# Patient Record
Sex: Female | Born: 1965
Health system: Southern US, Community
[De-identification: ages and names within clinical notes are randomized; demographics above are authoritative.]

## PROBLEM LIST (undated history)

## (undated) DIAGNOSIS — Z309 Encounter for contraceptive management, unspecified: Secondary | ICD-10-CM

## (undated) DIAGNOSIS — Z3046 Encounter for surveillance of implantable subdermal contraceptive: Secondary | ICD-10-CM

## (undated) DIAGNOSIS — I10 Essential (primary) hypertension: Secondary | ICD-10-CM

## (undated) DIAGNOSIS — H409 Unspecified glaucoma: Secondary | ICD-10-CM

## (undated) HISTORY — PX: CHOLECYSTECTOMY: SHX55

## (undated) HISTORY — DX: Unspecified glaucoma: H40.9

## (undated) HISTORY — DX: Essential (primary) hypertension: I10

## (undated) HISTORY — DX: Encounter for contraceptive management, unspecified: Z30.9

## (undated) HISTORY — DX: Encounter for surveillance of implantable subdermal contraceptive: Z30.46

---

## 2001-03-27 ENCOUNTER — Emergency Department (HOSPITAL_COMMUNITY): Admission: EM | Admit: 2001-03-27 | Discharge: 2001-03-27 | Payer: Self-pay | Admitting: Internal Medicine

## 2003-05-04 ENCOUNTER — Emergency Department (HOSPITAL_COMMUNITY): Admission: EM | Admit: 2003-05-04 | Discharge: 2003-05-04 | Payer: Self-pay | Admitting: Emergency Medicine

## 2004-05-13 ENCOUNTER — Ambulatory Visit (HOSPITAL_COMMUNITY): Admission: RE | Admit: 2004-05-13 | Discharge: 2004-05-13 | Payer: Self-pay | Admitting: Ophthalmology

## 2005-03-05 ENCOUNTER — Emergency Department (HOSPITAL_COMMUNITY): Admission: EM | Admit: 2005-03-05 | Discharge: 2005-03-05 | Payer: Self-pay | Admitting: Emergency Medicine

## 2006-11-14 ENCOUNTER — Emergency Department (HOSPITAL_COMMUNITY): Admission: EM | Admit: 2006-11-14 | Discharge: 2006-11-14 | Payer: Self-pay | Admitting: Emergency Medicine

## 2007-06-14 ENCOUNTER — Ambulatory Visit (HOSPITAL_COMMUNITY): Admission: RE | Admit: 2007-06-14 | Discharge: 2007-06-14 | Payer: Self-pay | Admitting: Family Medicine

## 2007-07-20 ENCOUNTER — Ambulatory Visit: Payer: Self-pay | Admitting: Orthopedic Surgery

## 2008-04-21 ENCOUNTER — Emergency Department (HOSPITAL_COMMUNITY): Admission: EM | Admit: 2008-04-21 | Discharge: 2008-04-21 | Payer: Self-pay | Admitting: Emergency Medicine

## 2008-05-02 ENCOUNTER — Ambulatory Visit (HOSPITAL_COMMUNITY): Admission: RE | Admit: 2008-05-02 | Discharge: 2008-05-02 | Payer: Self-pay | Admitting: Family Medicine

## 2008-08-26 ENCOUNTER — Ambulatory Visit (HOSPITAL_COMMUNITY): Admission: RE | Admit: 2008-08-26 | Discharge: 2008-08-26 | Payer: Self-pay | Admitting: Family Medicine

## 2009-04-24 ENCOUNTER — Encounter: Admission: RE | Admit: 2009-04-24 | Discharge: 2009-05-23 | Payer: Self-pay | Admitting: Family Medicine

## 2010-01-05 ENCOUNTER — Encounter
Admission: RE | Admit: 2010-01-05 | Discharge: 2010-03-03 | Payer: Self-pay | Admitting: Physical Medicine and Rehabilitation

## 2010-02-11 IMAGING — CR DG OS CALCIS 2+V*R*
2 series · 2 of 2 positions shown · non-contrast
Comparison: None

CLINICAL DATA: Right heel pain

RIGHT OS CALCIS - 2+ VIEW

[view not recorded (1 of 2)]
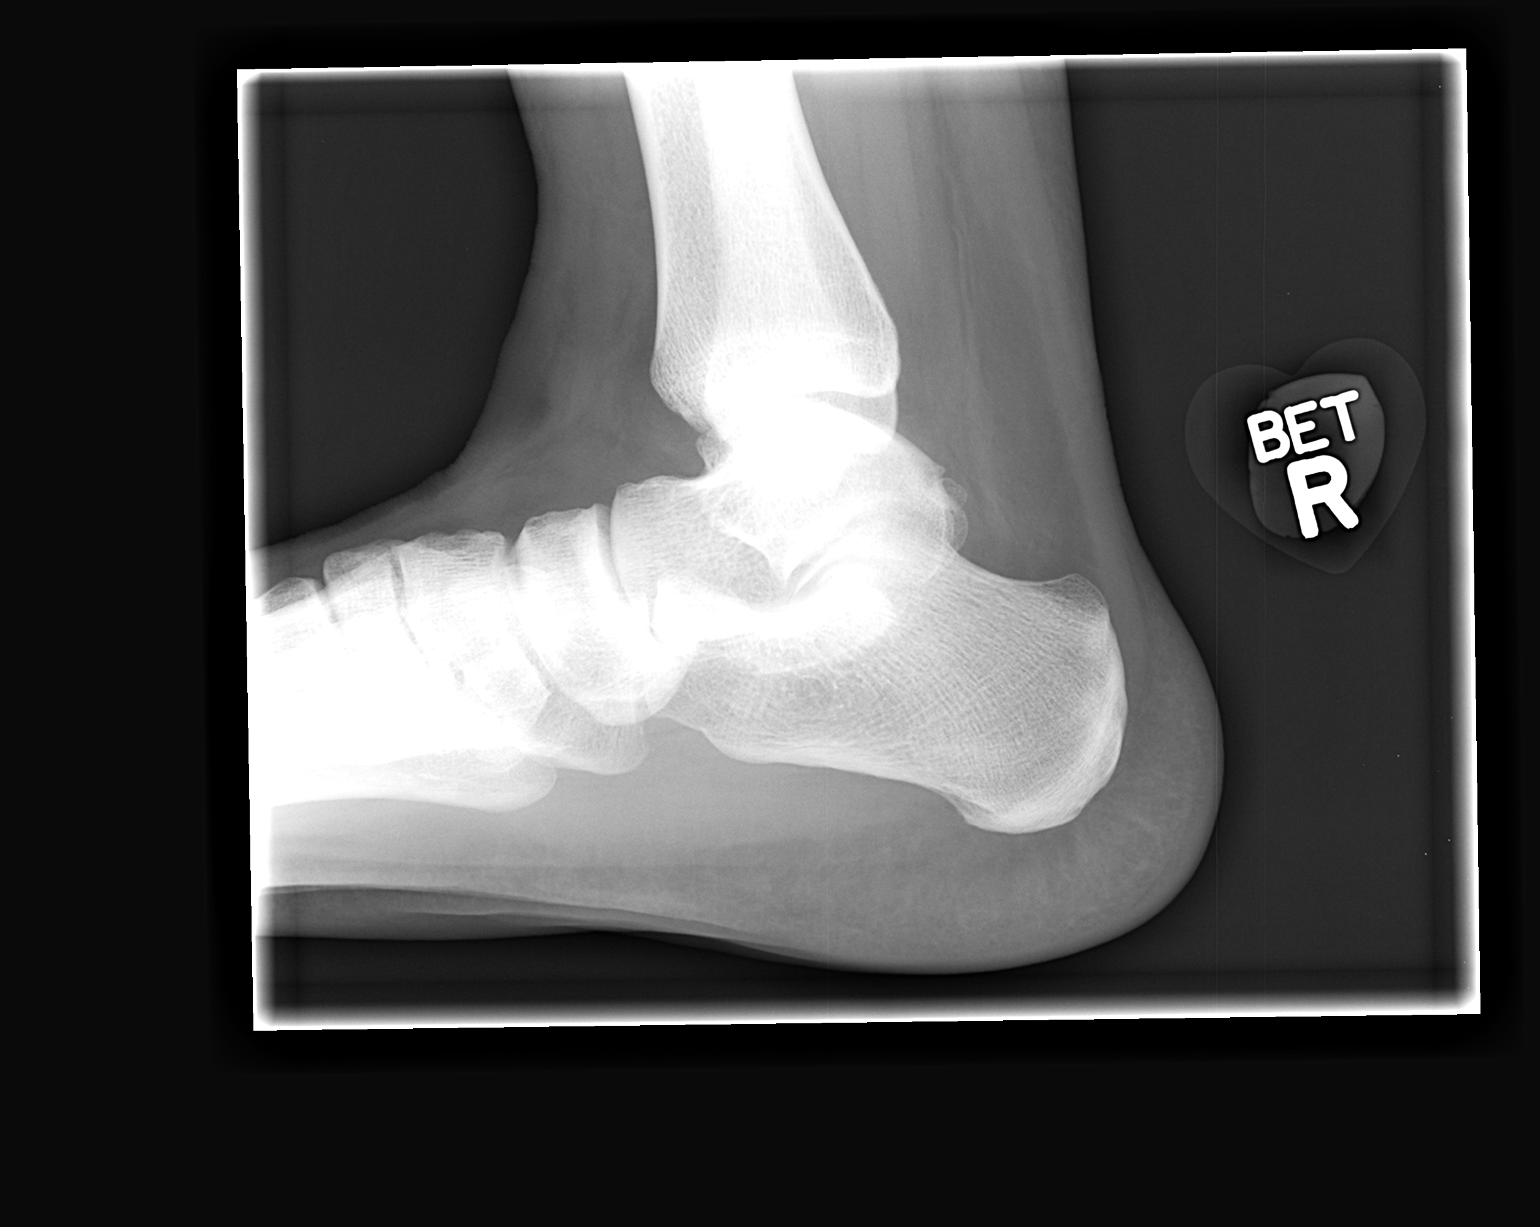

[view not recorded (2 of 2)]
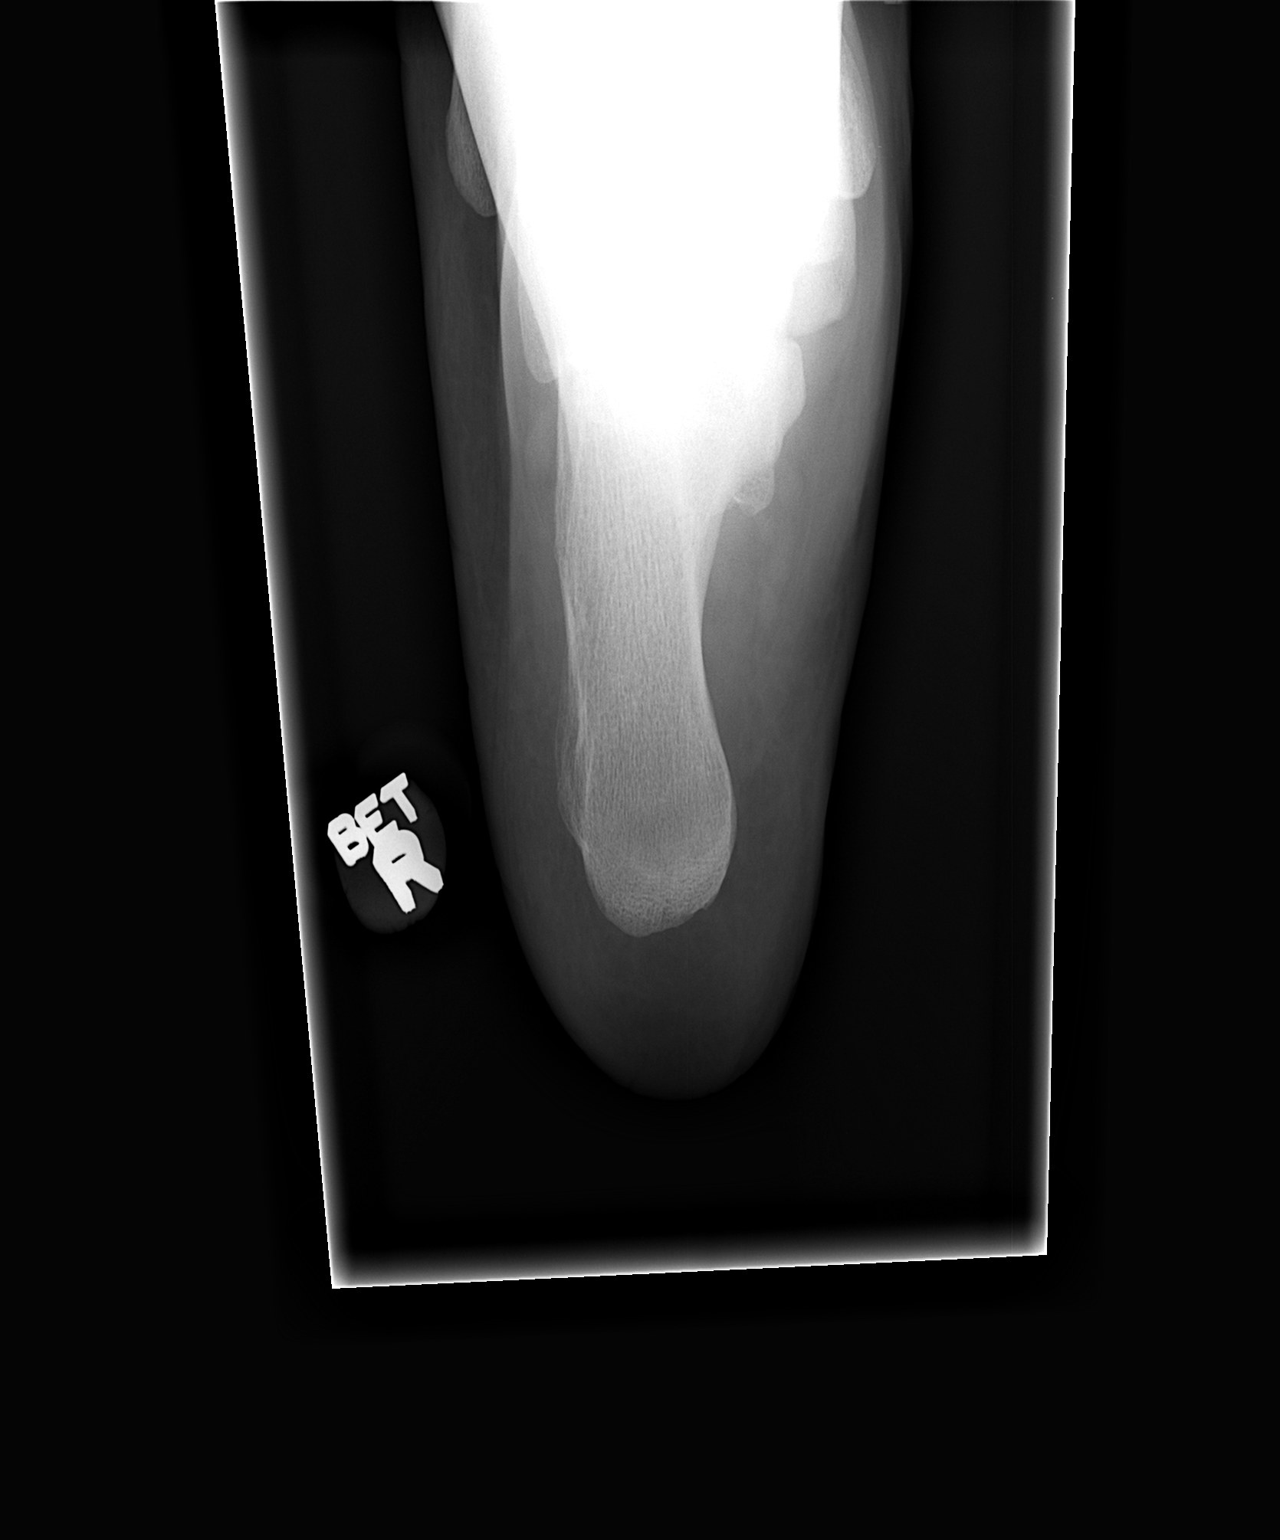

[2 of 2 positions shown; findings below may reference images not displayed]

FINDINGS: No fracture or dislocation.
Joint spaces preserved.
Bone mineralization normal.
No focal soft tissue abnormalities seen.
IMPRESSION: No acute bony abnormalities.

## 2010-09-03 ENCOUNTER — Ambulatory Visit (HOSPITAL_COMMUNITY): Admission: RE | Admit: 2010-09-03 | Discharge: 2010-09-03 | Payer: Self-pay | Admitting: Family Medicine

## 2013-04-25 ENCOUNTER — Telehealth: Payer: Self-pay | Admitting: Obstetrics & Gynecology

## 2013-04-25 NOTE — Telephone Encounter (Signed)
Noted and agree with cytotec and pre placement NSAIDs

## 2013-04-25 NOTE — Telephone Encounter (Signed)
Spoke with pt. Had Mirena placed 5 years ago at [facility]. Has an appt here in July to have it removed and replaced. Had a terrible experience with it as far as having a lot of pain. Pt will be a new pt here so has an appt first to discuss. She was told the IUD would not be placed at her first appt. She is requesting something to help make the insertion easier. I advised her to bring this up at her appt. I mentioned maybe the Dr would order Cytotec. Thanks!!! This is FYI.

## 2013-05-01 ENCOUNTER — Encounter: Payer: Self-pay | Admitting: Obstetrics & Gynecology

## 2013-05-01 ENCOUNTER — Encounter: Payer: Self-pay | Admitting: *Deleted

## 2013-05-02 ENCOUNTER — Encounter: Payer: Self-pay | Admitting: Obstetrics & Gynecology

## 2013-05-02 ENCOUNTER — Ambulatory Visit (INDEPENDENT_AMBULATORY_CARE_PROVIDER_SITE_OTHER): Payer: Medicaid Other | Admitting: Obstetrics & Gynecology

## 2013-05-02 VITALS — BP 110/82 | Ht 69.0 in | Wt 221.5 lb

## 2013-05-02 DIAGNOSIS — T8389XA Other specified complication of genitourinary prosthetic devices, implants and grafts, initial encounter: Secondary | ICD-10-CM

## 2013-05-02 DIAGNOSIS — N949 Unspecified condition associated with female genital organs and menstrual cycle: Secondary | ICD-10-CM

## 2013-05-02 MED ORDER — NAPROXEN SODIUM 550 MG PO TABS: 550.0000 mg | ORAL_TABLET | Freq: Two times a day (BID) | ORAL | Status: DC

## 2013-05-02 MED ORDER — MISOPROSTOL 200 MCG PO TABS: ORAL_TABLET | ORAL | Status: DC

## 2013-05-02 NOTE — Progress Notes (Signed)
Patient ID: Brittany Scott, female   DOB: [birth year], 47 y.o.   MRN: [id number] Patient had a lot of pain and had a vaso vagal response with last Mirena placement.  Might had PTSD(kinda sorta) from it now  Recommend  Pre procedure cytotec and Naproxen sodium Instructions given  Discussed para cervical block but should not need that

## 2013-05-02 NOTE — Patient Instructions (Addendum)

## 2013-05-10 ENCOUNTER — Encounter: Payer: Self-pay | Admitting: Advanced Practice Midwife

## 2013-05-10 ENCOUNTER — Ambulatory Visit (INDEPENDENT_AMBULATORY_CARE_PROVIDER_SITE_OTHER): Payer: Medicaid Other | Admitting: Advanced Practice Midwife

## 2013-05-10 VITALS — BP 130/84 | Ht 69.0 in | Wt 217.5 lb

## 2013-05-10 DIAGNOSIS — Z975 Presence of (intrauterine) contraceptive device: Secondary | ICD-10-CM

## 2013-05-10 DIAGNOSIS — Z3202 Encounter for pregnancy test, result negative: Secondary | ICD-10-CM

## 2013-05-10 DIAGNOSIS — Z30432 Encounter for removal of intrauterine contraceptive device: Secondary | ICD-10-CM

## 2013-05-10 DIAGNOSIS — Z30017 Encounter for initial prescription of implantable subdermal contraceptive: Secondary | ICD-10-CM

## 2013-05-10 LAB — POCT URINE PREGNANCY: Preg Test, Ur: NEGATIVE

## 2013-05-10 NOTE — Addendum Note (Signed)
Addended by: Jacklyn Shell on: 05/10/2013 11:26 AM   Modules accepted: Orders

## 2013-05-10 NOTE — Progress Notes (Signed)
Here for IUD removal.  She had the Mirena IUD placed 5 years ago and would like it removed.  At the time of insertion, she fainted and it was very painful.  She is very nervous abaout today's visit.  Her plans for future contraception are IUD.  A graves speculum was placed, and the strings were visible.  They were grasped with a curved Tresa Endo and the IUD easily removed.   A dilator was then introduced into the cervix, and the pt immediately asked me to stop because it hurt so much.  At this time, she opted not to reinsert the IUD and wants to try nexplanon instead.     Risks/benefits/side effects of Nexplanon have been discussed and her questions have been answered.  Specifically, a failure rate of 11/998 has been reported, with an increased failure rate if pt takes St. John's Wort and/or antiseizure medicaitons.  Brittany Scott is aware of the common side effect of irregular bleeding, which the incidence of decreases over time.  Her left arm, approximatly 4 inches proximal from the elbow, was cleansed with alcohol and anesthetized with 2cc of 2% Lidocaine.  The area was cleansed again and the Nexplanon was inserted without difficulty.  A pressure bandage was applied.  Pt was instructed to remove pressure bandage in a few hours, and keep insertion site covered with a bandaid for 3 days.  Back up contraception was recommended for 1 week.  Follow-up scheduled PRN problems  Brittany Scott,Brittany Scott 05/10/2013 11:17 AM

## 2013-05-10 NOTE — Patient Instructions (Signed)

## 2013-05-14 ENCOUNTER — Telehealth: Payer: Self-pay | Admitting: Adult Health

## 2013-05-14 NOTE — Telephone Encounter (Signed)
Spoke with pt. Had Nexplanon inserted Thursday. Still having some bleeding, but not heavy. Advised this sounds normal. Going on vacation Friday. Advised may still have some bleeding then but hopefully will not get any heavier. Since bleeding is not heavy at this point, we recommend no further treatment. Pt voiced understanding. JSY

## 2013-08-22 ENCOUNTER — Encounter (HOSPITAL_COMMUNITY): Payer: Self-pay | Admitting: Emergency Medicine

## 2013-08-22 ENCOUNTER — Emergency Department (HOSPITAL_COMMUNITY)
Admission: EM | Admit: 2013-08-22 | Discharge: 2013-08-22 | Disposition: A | Discharge status: Home / Self Care | Payer: Medicaid Other | Attending: Emergency Medicine | Admitting: Emergency Medicine

## 2013-08-22 DIAGNOSIS — Z86718 Personal history of other venous thrombosis and embolism: Secondary | ICD-10-CM | POA: Insufficient documentation

## 2013-08-22 DIAGNOSIS — Z853 Personal history of malignant neoplasm of breast: Secondary | ICD-10-CM | POA: Insufficient documentation

## 2013-08-22 DIAGNOSIS — Z87891 Personal history of nicotine dependence: Secondary | ICD-10-CM | POA: Insufficient documentation

## 2013-08-22 DIAGNOSIS — Z8669 Personal history of other diseases of the nervous system and sense organs: Secondary | ICD-10-CM | POA: Insufficient documentation

## 2013-08-22 DIAGNOSIS — H65191 Other acute nonsuppurative otitis media, right ear: Secondary | ICD-10-CM

## 2013-08-22 DIAGNOSIS — Z792 Long term (current) use of antibiotics: Secondary | ICD-10-CM | POA: Insufficient documentation

## 2013-08-22 DIAGNOSIS — H65199 Other acute nonsuppurative otitis media, unspecified ear: Secondary | ICD-10-CM | POA: Insufficient documentation

## 2013-08-22 DIAGNOSIS — Z86711 Personal history of pulmonary embolism: Secondary | ICD-10-CM | POA: Insufficient documentation

## 2013-08-22 DIAGNOSIS — Z79899 Other long term (current) drug therapy: Secondary | ICD-10-CM | POA: Insufficient documentation

## 2013-08-22 MED ORDER — AMOXICILLIN 500 MG PO CAPS: 500.0000 mg | ORAL_CAPSULE | Freq: Three times a day (TID) | ORAL | Status: DC

## 2013-08-22 MED ORDER — ANTIPYRINE-BENZOCAINE 5.4-1.4 % OT SOLN: 3.0000 [drp] | OTIC | Status: DC | PRN

## 2013-08-22 NOTE — ED Notes (Addendum)
Pt c/o right ear pain that started 3 days ago and is progressively getting worse. Pt states she has a hx of ear infections and this may feel like that. Pt states when she yawns and swallows her ear pain increases. Pt denies any problems hearing out of her right ear. Pt states she would not be able to go to sleep tonight because the pain is so bad. Pt denies any nasal congestions. Pt is A&O X4.

## 2013-08-22 NOTE — ED Provider Notes (Signed)
CSN: [id number]     Arrival date & time 08/22/13  2119 History  This chart was scribed for Dierdre Forth, PA-C, working with Junius Argyle, MD by Blanchard Kelch, ED Scribe. This patient was seen in room TR07C/TR07C and the patient's care was started at 10:00 PM.    Chief Complaint  Patient presents with  . Otalgia    Patient is a 47 y.o. female presenting with ear pain. The history is provided by the patient. No language interpreter was used.  Otalgia Associated symptoms: no congestion, no fever, no rash, no sore throat and no vomiting     HPI Comments: Brittany Scott is a 47 y.o. female who presents to the Emergency Department complaining of constant, worsening right ear pain that began three days ago. The pain is worsened by laughing, swallowing or jaw movement. She denies fever, nausea, vomiting, congestion, sore throat. She denies any surgery on her ears. She takes eye drops for glaucoma on a daily basis.  She denies having a PCP but goes to the health department in [place].  Past Medical History  Diagnosis Date  . DVT (deep venous thrombosis)   . PE (pulmonary embolism)   . Cancer     breast  . Glaucoma    History reviewed. No pertinent past surgical history. Family History  Problem Relation Age of Onset  . Hypertension Mother   . Diabetes Brother    History  Substance Use Topics  . Smoking status: Former Smoker    Types: Cigarettes  . Smokeless tobacco: Not on file  . Alcohol Use: Yes     Comment: socially   OB History   Grav Para Term Preterm Abortions TAB SAB Ect Mult Living   1 1 1       1      Review of Systems  Constitutional: Negative for fever.  HENT: Positive for ear pain. Negative for congestion and sore throat.   Gastrointestinal: Negative for nausea and vomiting.  Musculoskeletal: Negative for gait problem.  Skin: Negative for rash.  Neurological: Negative for speech difficulty.  Hematological: Negative for adenopathy.   Psychiatric/Behavioral: Negative for confusion.  All other systems reviewed and are negative.    Allergies  Review of patient's allergies indicates no known allergies.  Home Medications   Current Outpatient Rx  Name  Route  Sig  Dispense  Refill  . etonogestrel (NEXPLANON) 68 MG IMPL implant   Subcutaneous   Inject 1 each into the skin once.         . latanoprost (XALATAN) 0.005 % ophthalmic solution   Both Eyes   Place 1 drop into both eyes at bedtime.          . timolol (TIMOPTIC) 0.25 % ophthalmic solution   Both Eyes   Place 1 drop into both eyes 2 (two) times daily.          Marland Kitchen amoxicillin (AMOXIL) 500 MG capsule   Oral   Take 1 capsule (500 mg total) by mouth 3 (three) times daily.   30 capsule   0   . antipyrine-benzocaine (AURALGAN) otic solution   Right Ear   Place 3 drops into the right ear every 2 (two) hours as needed for pain.   10 mL   0    Triage Vitals: BP 152/95  Pulse 70  Temp(Src) 97.1 F (36.2 C)  Resp 18  SpO2 97%  LMP 06/22/2013  Physical Exam  Nursing note and vitals reviewed. Constitutional: She is oriented to  person, place, and time. She appears well-developed and well-nourished. No distress.  HENT:  Head: Normocephalic and atraumatic.  Right Ear: External ear and ear canal normal. Tympanic membrane is erythematous (mildly) and bulging. A middle ear effusion is present.  Left Ear: Tympanic membrane, external ear and ear canal normal. Tympanic membrane is not erythematous and not bulging.  No middle ear effusion.  Nose: Nose normal. No mucosal edema or rhinorrhea. No epistaxis. Right sinus exhibits no maxillary sinus tenderness and no frontal sinus tenderness. Left sinus exhibits no maxillary sinus tenderness and no frontal sinus tenderness.  Mouth/Throat: Uvula is midline and mucous membranes are normal. Mucous membranes are not pale and not cyanotic. No uvula swelling. No oropharyngeal exudate, posterior oropharyngeal edema,  posterior oropharyngeal erythema or tonsillar abscesses.  Eyes: Conjunctivae are normal. Pupils are equal, round, and reactive to light.  Neck: Normal range of motion and full passive range of motion without pain.  Cardiovascular: Normal rate, normal heart sounds and intact distal pulses.   No murmur heard. Pulmonary/Chest: Effort normal and breath sounds normal. No stridor. No respiratory distress. She has no wheezes.  Abdominal: Soft. Bowel sounds are normal. There is no tenderness.  Musculoskeletal: Normal range of motion.  Lymphadenopathy:    She has no cervical adenopathy.  Neurological: She is alert and oriented to person, place, and time.  Skin: Skin is dry. No rash noted. She is not diaphoretic.  Psychiatric: She has a normal mood and affect.    ED Course  Procedures (including critical care time)  DIAGNOSTIC STUDIES: Oxygen Saturation is 97% on room air, normal by my interpretation.    COORDINATION OF CARE: 10:02 PM -Will order amoxicillin and ear drops for pain. Patient verbalizes understanding and agrees with treatment plan.    Labs Review Labs Reviewed - No data to display Imaging Review No results found.  EKG Interpretation   None       MDM   1. Acute nonsuppurative otitis media of right ear      Brittany Scott presents with otalgia and Hx and PE consistent with OM.  No concern for acute mastoiditis, meningitis.  No antibiotic use in the last month.  Patient discharged home with Amoxicillin.   Pt advised to find PCP follow-up.  I have also discussed reasons to return immediately to the ER.  Parent expresses understanding and agrees with plan.  It has been determined that no acute conditions requiring further emergency intervention are present at this time. The patient/guardian have been advised of the diagnosis and plan. We have discussed signs and symptoms that warrant return to the ED, such as changes or worsening in symptoms.   Vital signs are stable  at discharge.   BP 152/95  Pulse 70  Temp(Src) 97.1 F (36.2 C)  Resp 18  SpO2 97%  LMP 06/22/2013  Patient/guardian has voiced understanding and agreed to follow-up with the PCP or specialist.    I personally performed the services described in this documentation, which was scribed in my presence. The recorded information has been reviewed and is accurate.     Dahlia Client Adhira Jamil, PA-C 08/22/13 2219

## 2013-08-23 NOTE — ED Provider Notes (Signed)
Medical screening examination/treatment/procedure(s) were performed by non-physician practitioner and as supervising physician I was immediately available for consultation/collaboration.     Jacoby Ritsema S Addalynn Kumari, MD 08/23/13 0145 

## 2013-09-11 ENCOUNTER — Other Ambulatory Visit (HOSPITAL_COMMUNITY): Payer: Self-pay | Admitting: Nurse Practitioner

## 2013-09-11 DIAGNOSIS — Z139 Encounter for screening, unspecified: Secondary | ICD-10-CM

## 2013-09-21 ENCOUNTER — Ambulatory Visit (HOSPITAL_COMMUNITY)
Admission: RE | Admit: 2013-09-21 | Discharge: 2013-09-21 | Disposition: A | Discharge status: Home / Self Care | Payer: Medicaid Other | Source: Ambulatory Visit | Attending: Nurse Practitioner | Admitting: Nurse Practitioner

## 2013-09-21 DIAGNOSIS — Z1231 Encounter for screening mammogram for malignant neoplasm of breast: Secondary | ICD-10-CM | POA: Insufficient documentation

## 2013-09-21 DIAGNOSIS — Z139 Encounter for screening, unspecified: Secondary | ICD-10-CM

## 2013-10-16 ENCOUNTER — Emergency Department (HOSPITAL_COMMUNITY)
Admission: EM | Admit: 2013-10-16 | Discharge: 2013-10-16 | Disposition: A | Discharge status: Home / Self Care | Payer: Medicaid Other | Attending: Emergency Medicine | Admitting: Emergency Medicine

## 2013-10-16 ENCOUNTER — Encounter (HOSPITAL_COMMUNITY): Payer: Self-pay | Admitting: Emergency Medicine

## 2013-10-16 DIAGNOSIS — Z86711 Personal history of pulmonary embolism: Secondary | ICD-10-CM | POA: Insufficient documentation

## 2013-10-16 DIAGNOSIS — Z87891 Personal history of nicotine dependence: Secondary | ICD-10-CM | POA: Insufficient documentation

## 2013-10-16 DIAGNOSIS — Z86718 Personal history of other venous thrombosis and embolism: Secondary | ICD-10-CM | POA: Insufficient documentation

## 2013-10-16 DIAGNOSIS — Z853 Personal history of malignant neoplasm of breast: Secondary | ICD-10-CM | POA: Insufficient documentation

## 2013-10-16 DIAGNOSIS — H9201 Otalgia, right ear: Secondary | ICD-10-CM

## 2013-10-16 DIAGNOSIS — Z79899 Other long term (current) drug therapy: Secondary | ICD-10-CM | POA: Insufficient documentation

## 2013-10-16 DIAGNOSIS — H9209 Otalgia, unspecified ear: Secondary | ICD-10-CM | POA: Insufficient documentation

## 2013-10-16 MED ORDER — FLUTICASONE PROPIONATE 50 MCG/ACT NA SUSP: 2.0000 | Freq: Every day | NASAL | Status: DC

## 2013-10-16 NOTE — ED Provider Notes (Signed)
CSN: [id number]     Arrival date & time 10/16/13  1933 History     Chief Complaint  Patient presents with  . Otalgia   HPI Comments: Patient is a 47 year old female who presents today for right ear pain x3 weeks. Patient states that pain is pressure-like in nature and nonradiating. Pain originated 3 weeks ago at which time patient was evaluated and diagnosed with an ear infection. Patient took amoxicillin and used to all drops with relief of symptoms for one week. Patient states, however, that symptoms returned one week ago and have been constant since this time. She denies any modifying factors of her symptoms. Patient denies associated fever, eye redness or pain, sinus congestion or rhinorrhea, hearing loss, tinnitus, ear drainage, bleeding from the ear, outer ear redness or swelling, and neck pain or stiffness.  The history is provided by the patient. No language interpreter was used.   HPI Comments:   Past Medical History  Diagnosis Date  . DVT (deep venous thrombosis)   . PE (pulmonary embolism)   . Cancer     breast  . Glaucoma    History reviewed. No pertinent past surgical history. Family History  Problem Relation Age of Onset  . Hypertension Mother   . Diabetes Brother    History  Substance Use Topics  . Smoking status: Former Smoker    Types: Cigarettes  . Smokeless tobacco: Not on file  . Alcohol Use: Yes     Comment: socially   OB History   Grav Para Term Preterm Abortions TAB SAB Ect Mult Living   1 1 1       1      Review of Systems  HENT: Positive for ear pain.   All other systems reviewed and are negative.    Allergies  Review of patient's allergies indicates no known allergies.  Home Medications   Current Outpatient Rx  Name  Route  Sig  Dispense  Refill  . latanoprost (XALATAN) 0.005 % ophthalmic solution   Both Eyes   Place 1 drop into both eyes at bedtime.          . timolol (TIMOPTIC) 0.25 % ophthalmic solution   Both Eyes   Place 1  drop into both eyes 2 (two) times daily.          Marland Kitchen etonogestrel (NEXPLANON) 68 MG IMPL implant   Subcutaneous   Inject 1 each into the skin once.         . fluticasone (FLONASE) 50 MCG/ACT nasal spray   Each Nare   Place 2 sprays into both nostrils daily.   16 g   0    Triage Vitals:BP 142/86  Pulse 72  Temp(Src) 97.6 F (36.4 C) (Oral)  Resp 17  Wt 234 lb 1 oz (106.17 kg)  SpO2 98%  Physical Exam  Nursing note and vitals reviewed. Constitutional: She is oriented to person, place, and time. She appears well-developed and well-nourished. No distress.  Patient pleasant and well-appearing; in no acute distress  HENT:  Head: Normocephalic and atraumatic.  Right Ear: External ear normal. No drainage or tenderness. No mastoid tenderness. Tympanic membrane is not injected, not scarred, not perforated, not erythematous, not retracted and not bulging. No decreased hearing is noted.  Left Ear: External ear normal. No drainage or tenderness. No mastoid tenderness. Tympanic membrane is not injected, not scarred, not perforated, not erythematous, not retracted and not bulging. No decreased hearing is noted.  Nose: Nose normal. Right sinus exhibits  no maxillary sinus tenderness and no frontal sinus tenderness. Left sinus exhibits no maxillary sinus tenderness and no frontal sinus tenderness.  Mouth/Throat: Uvula is midline, oropharynx is clear and moist and mucous membranes are normal. No trismus in the jaw.  Gross hearing to 3 feet b/l. External ear appears normal b/l. No mastoid tenderness, swelling, erythema, or heat to touch b/l. No tenderness when pulling on auricle or palpating tragus b/l. Canals and TMs without erythema or swelling; No TM bulging, retraction, or perforation.  Airway patent and patient tolerating secretions without difficulty.  Eyes: Conjunctivae and EOM are normal. Pupils are equal, round, and reactive to light. No scleral icterus.  Neck: Normal range of motion. Neck  supple.  Patient moves neck with ease.  Pulmonary/Chest: Effort normal. No respiratory distress.  Musculoskeletal: Normal range of motion.  Neurological: She is alert and oriented to person, place, and time.  GCS 15. Patient moves extremities without ataxia.  Skin: Skin is warm and dry. No rash noted. She is not diaphoretic. No erythema. No pallor.  Psychiatric: She has a normal mood and affect. Her behavior is normal.    ED Course  Procedures  DIAGNOSTIC STUDIES: Oxygen Saturation is 98% on RA, normal by my interpretation.    Labs Review Labs Reviewed - No data to display Imaging Review No results found.  EKG Interpretation   None       MDM   1. Otalgia of right ear    Uncomplicated otalgia of the right ear. Patient well and nontoxic appearing, hemodynamically stable, and afebrile. No mastoid swelling, erythema, or heat to touch bilaterally. Hearing grossly intact and physical examination of bilateral ears unremarkable. No evidence of otitis media. No auricular or tragal tenderness. Patient without nuchal rigidity or meningismus.  Given persistence of symptoms, believe patient warrants followup with ear nose and throat specialist for further evaluation of R otalgia. No acute emergent processes identified on physical examination today. As patient describes the pain as a pressure sensation, will prescribe Flonase to attempt to decrease any residual sinus congestion which may be perpetuating symptoms. Return precautions discussed with the patient who verbalizes comfort and understanding with this d/c plan.   Filed Vitals:   10/16/13 1941  BP: 142/86  Pulse: 72  Temp: 97.6 F (36.4 C)  TempSrc: Oral  Resp: 17  Weight: 234 lb 1 oz (106.17 kg)  SpO2: 98%      Antony Madura, PA-C 10/16/13 2248

## 2013-10-16 NOTE — ED Notes (Signed)
?   Unresolved rt. Ear infection. Did take amoxicillin.

## 2013-10-16 NOTE — ED Notes (Signed)
Pt st's she was here approx 1 month ago with right ear pain.  St's she was given amoxicillin and ear gtts.   Pt st's she took all the antibiotic but ear is still painful.

## 2013-10-20 NOTE — ED Provider Notes (Signed)
Medical screening examination/treatment/procedure(s) were performed by non-physician practitioner and as supervising physician I was immediately available for consultation/collaboration.  EKG Interpretation   None         Usman Millett J Joline Encalada, MD 10/20/13 0706 

## 2013-11-05 ENCOUNTER — Emergency Department (HOSPITAL_COMMUNITY)
Admission: EM | Admit: 2013-11-05 | Discharge: 2013-11-05 | Disposition: A | Discharge status: Home / Self Care | Payer: Medicaid Other | Attending: Emergency Medicine | Admitting: Emergency Medicine

## 2013-11-05 ENCOUNTER — Encounter (HOSPITAL_COMMUNITY): Payer: Self-pay | Admitting: Emergency Medicine

## 2013-11-05 DIAGNOSIS — Z79899 Other long term (current) drug therapy: Secondary | ICD-10-CM | POA: Insufficient documentation

## 2013-11-05 DIAGNOSIS — H9201 Otalgia, right ear: Secondary | ICD-10-CM

## 2013-11-05 DIAGNOSIS — H9209 Otalgia, unspecified ear: Secondary | ICD-10-CM | POA: Insufficient documentation

## 2013-11-05 DIAGNOSIS — H748X9 Other specified disorders of middle ear and mastoid, unspecified ear: Secondary | ICD-10-CM | POA: Insufficient documentation

## 2013-11-05 DIAGNOSIS — IMO0002 Reserved for concepts with insufficient information to code with codable children: Secondary | ICD-10-CM | POA: Insufficient documentation

## 2013-11-05 MED ORDER — FLUCONAZOLE 150 MG PO TABS: 150.0000 mg | ORAL_TABLET | Freq: Once | ORAL | Status: DC

## 2013-11-05 MED ORDER — ANTIPYRINE-BENZOCAINE 5.4-1.4 % OT SOLN: 3.0000 [drp] | OTIC | Status: DC | PRN

## 2013-11-05 MED ORDER — AMOXICILLIN 500 MG PO CAPS: 1000.0000 mg | ORAL_CAPSULE | Freq: Three times a day (TID) | ORAL | Status: DC

## 2013-11-05 NOTE — Discharge Instructions (Signed)
1. Medications: Amoxicillin, Auralgan, usual home medications  2. Treatment: rest, drink plenty of fluids, take medications as prescribed  3. Follow Up: Please followup with your primary doctor and ENT specialist for discussion of your diagnoses and further evaluation after today's visit  Otitis Media with Effusion  Otitis media with effusion is the presence of fluid in the middle ear. This is a common problem that often follows ear infections. It may be present for weeks or longer after the infection. Unlike an acute ear infection, otits media with effusion refers only to fluid behind the ear drum and not infection. Children with repeated ear and sinus infections and allergy problems are the most likely to get otitis media with effusion.  CAUSES  The most frequent cause of the fluid buildup is dysfunction of the eustacian tubes. These are the tubes that drain fluid in the ears to the throat.  SYMPTOMS  The main symptom of this condition is hearing loss. As a result, you or your child may:  Listen to the TV at a loud volume.  Not respond to questions.  Ask "what" often when spoken to.  There may be a sensation of fullness or pressure but usually not pain. DIAGNOSIS  Your caregiver will diagnose this condition by examining you or your child's ears.  Your caregiver may test the pressure in you or your child's ear with a tympanometer.  A hearing test may be conducted if the problem persists.  A caregiver will want to re-evaluate the condition periodically to see if it improves. TREATMENT  Treatment depends on the duration and the effects of the effusion.  Antibiotics, decongestants, nose drops, and cortisone-type drugs may not be helpful.  Children with persistent ear effusions may have delayed language. Children at risk for developmental delays in hearing, learning, and speech may require referral to a specialist earlier than children not at risk.  You or your child's caregiver may suggest a  referral to an Ear, Nose, and Throat (ENT) surgeon for treatment. The following may help restore normal hearing:  Drainage of fluid.  Placement of ear tubes (tympanostomy tubes).  Removal of adenoids (adenoidectomy). HOME CARE INSTRUCTIONS  Avoid second hand smoke.  Infants who are breast fed are less likely to have this condition.  Avoid feeding infants while laying flat.  Avoid known environmental allergens.  Be sure to see a caregiver or an ENT specialist for follow up.  Avoid people who are sick. SEEK MEDICAL CARE IF:  Hearing is not better in 3 months.  Hearing is worse.  Ear pain.  Drainage from the ear.  Dizziness. Document Released: 11/25/2004 Document Revised: 01/10/2012 Document Reviewed: 03/10/2010  [facility] Patient Information 2014 [employer], [place].

## 2013-11-05 NOTE — ED Provider Notes (Signed)
CSN: [id number]     Arrival date & time 11/05/13  1930 History   This chart was scribed for non-physician practitioner Antony Madura, PA-C,working with Rolan Bucco, MD by Nicholos Johns, ED scribe. This patient was seen in room WTR5/WTR5 and the patient's care was started at 9:49 PM.  Chief Complaint  Patient presents with  . Otalgia   Patient is a 48 y.o. female presenting with ear pain. The history is provided by the patient. No language interpreter was used.  Otalgia Location:  Right Behind ear:  No abnormality Quality:  Aching Severity:  Mild Onset quality:  Gradual Duration:  2 months Timing:  Constant Progression:  Unchanged Associated symptoms: no ear discharge, no fever, no hearing loss and no neck pain    HPI Comments: Brittany Scott is a 48 y.o. female who presents to the Emergency Department complaining of unchanged right ear pain since October. Pt states pain is worse at night when she lays down, also worse when swallowing. Reports mild temporary relief upon clearing her throat. Pt was seen on 10/16/13 for same pain, was referred to ENT but states she cannot be seen by them until she goes through her PCP who cannot see her until 11/21/13. Pt was placed on amoxicillin at past visit and states that resolved the issue temporarily. Pt was also prescribed Flonase that provided no relief. Denies fever, swelling of outer ear, hearing loss, neck pain/stiffness, bleeding/discharge from ear, or eye pain.  Past Medical History  Diagnosis Date  . Glaucoma    Past Surgical History  Procedure Laterality Date  . Cholecystectomy     Family History  Problem Relation Age of Onset  . Hypertension Mother   . Diabetes Brother   . Glaucoma Father    History  Substance Use Topics  . Smoking status: Never Smoker   . Smokeless tobacco: Not on file  . Alcohol Use: Yes     Comment: socially   OB History   Grav Para Term Preterm Abortions TAB SAB Ect Mult Living   1 1 1       1       Review of Systems  Constitutional: Negative for fever.  HENT: Positive for ear pain. Negative for ear discharge and hearing loss.   Eyes: Negative for pain.  Musculoskeletal: Negative for neck pain and neck stiffness.  All other systems reviewed and are negative.    Allergies  Review of patient's allergies indicates no known allergies.  Home Medications   Current Outpatient Rx  Name  Route  Sig  Dispense  Refill  . etonogestrel (NEXPLANON) 68 MG IMPL implant   Subcutaneous   Inject 1 each into the skin once.         . fluticasone (FLONASE) 50 MCG/ACT nasal spray   Each Nare   Place 2 sprays into both nostrils daily.   16 g   0   . latanoprost (XALATAN) 0.005 % ophthalmic solution   Both Eyes   Place 1 drop into both eyes at bedtime.          . timolol (TIMOPTIC) 0.25 % ophthalmic solution   Both Eyes   Place 1 drop into both eyes 2 (two) times daily.          Marland Kitchen amoxicillin (AMOXIL) 500 MG capsule   Oral   Take 2 capsules (1,000 mg total) by mouth 3 (three) times daily.   60 capsule   0   . antipyrine-benzocaine (AURALGAN) otic solution   Right  Ear   Place 3-4 drops into the right ear every 2 (two) hours as needed for ear pain.   15 mL   0    Triage Vitals: BP 146/84  Pulse 65  Temp(Src) 98.4 F (36.9 C) (Oral)  Resp 18  Ht 5\' 9"  (1.753 m)  Wt 225 lb (102.059 kg)  BMI 33.21 kg/m2  SpO2 100%  Physical Exam  Nursing note and vitals reviewed. Constitutional: She is oriented to person, place, and time. She appears well-developed and well-nourished. No distress.  HENT:  Head: Normocephalic and atraumatic.  Right Ear: External ear normal. No drainage or tenderness. No mastoid tenderness. Tympanic membrane is not perforated, not erythematous, not retracted and not bulging. A middle ear effusion is present.  Left Ear: Tympanic membrane, external ear and ear canal normal. No drainage. No mastoid tenderness. Tympanic membrane is not perforated, not  erythematous, not retracted and not bulging.  No middle ear effusion.  Nose: Nose normal.  No tenderness, swelling, or erythema to mastoid process bilaterally. No external ear tenderness or swelling to R ear.  Eyes: Conjunctivae and EOM are normal. No scleral icterus.  Neck: Normal range of motion. Neck supple.  No nuchal rigidity or meningismus  Pulmonary/Chest: Effort normal. No respiratory distress.  Musculoskeletal: Normal range of motion.  Neurological: She is alert and oriented to person, place, and time.  Skin: Skin is warm and dry. No rash noted. She is not diaphoretic. No erythema. No pallor.  Psychiatric: She has a normal mood and affect. Her behavior is normal.    ED Course  Procedures (including critical care time) DIAGNOSTIC STUDIES: Oxygen Saturation is 100% on room air, normal by my interpretation.    COORDINATION OF CARE: At 9:44 PM: Discussed treatment plan with patient which includes amoxicillin and following up with her PCP. Patient agrees.   Labs Review Labs Reviewed - No data to display Imaging Review No results found.  EKG Interpretation   None       MDM   1. Otalgia of right ear    Uncomplicated otalgia of the right ear. Symptoms have been ongoing for the past 2 months. Patient denies any trauma or injury to her ear. She denies any fever, neck pain or stiffness, or hearing loss. Patient well and nontoxic appearing, hemodynamically stable, and afebrile. Physical exam significant for a middle ear effusion on the right of clear serous fluid. Patient has not followed up with ENT as previously recommended. Have again referred patient to ENT as well as to her primary care doctor. She endorses a follow up appointment in the coming weeks. Patient appropriate for discharge with course of amoxicillin and Auralgan which provided her some relief back in October. Return precautions discussed and patient agreeable to plan with no unaddressed concerns.  I personally  performed the services described in this documentation, which was scribed in my presence. The recorded information has been reviewed and is accurate.       Antony MaduraKelly Justyne Roell, PA-C 11/07/13 [phone]

## 2013-11-05 NOTE — ED Notes (Signed)
Pt is c/o right ear pain  Pt states she was seen for this before and was referred to an ENT but was going to follow up with her PCP but they cannot see her until the 21st  Pt states the pain is between her ear and her throat and feels like it is draining on that side causing her to cough

## 2013-11-17 NOTE — ED Provider Notes (Signed)
Medical screening examination/treatment/procedure(s) were performed by non-physician practitioner and as supervising physician I was immediately available for consultation/collaboration.  EKG Interpretation   None         Aleah Ahlgrim, MD 11/17/13 1102 

## 2014-07-30 ENCOUNTER — Ambulatory Visit (INDEPENDENT_AMBULATORY_CARE_PROVIDER_SITE_OTHER): Payer: Medicaid Other | Admitting: Adult Health

## 2014-07-30 ENCOUNTER — Encounter: Payer: Self-pay | Admitting: Adult Health

## 2014-07-30 VITALS — BP 138/86 | Ht 69.0 in | Wt 232.5 lb

## 2014-07-30 DIAGNOSIS — Z3046 Encounter for surveillance of implantable subdermal contraceptive: Secondary | ICD-10-CM | POA: Insufficient documentation

## 2014-07-30 DIAGNOSIS — Z309 Encounter for contraceptive management, unspecified: Secondary | ICD-10-CM

## 2014-07-30 DIAGNOSIS — Z3202 Encounter for pregnancy test, result negative: Secondary | ICD-10-CM

## 2014-07-30 DIAGNOSIS — Z30011 Encounter for initial prescription of contraceptive pills: Secondary | ICD-10-CM

## 2014-07-30 HISTORY — DX: Encounter for surveillance of implantable subdermal contraceptive: Z30.46

## 2014-07-30 HISTORY — DX: Encounter for contraceptive management, unspecified: Z30.9

## 2014-07-30 LAB — POCT URINE PREGNANCY: Preg Test, Ur: NEGATIVE

## 2014-07-30 MED ORDER — LEVONORG-ETH ESTRAD TRIPHASIC PO TABS
1.0000 | ORAL_TABLET | Freq: Every day | ORAL | Status: DC
Start: 2014-07-30 — End: 2014-11-14

## 2014-07-30 NOTE — Progress Notes (Signed)
Subjective:     Patient ID: Brittany Scott, female   DOB: 1965/11/26, 48 y.o.   MRN: [id number]  HPI Brittany Scott is a 48 year old black female, in for nexplanon removal, has been having headaches and wants to go on Triphasil used to take in past.  Review of Systems See HPI Reviewed past medical,surgical, social and family history. Reviewed medications and allergies.     Objective:   Physical Exam BP 138/86  Ht 5\' 9"  (1.753 m)  Wt 232 lb 8 oz (105.461 kg)  BMI 34.32 kg/m2  LMP 09/15/2015UPT negative, verbal consent obtained,time out called, Left arm cleansed with betadine, and injected with 1.5 cc 2% lidocaine and waited til numb.Under sterile technique a #11 blade was used to make small vertical incision, and a curved forceps was used to easily remove rod. Steri strips applied. Pressure dressing applied.     Assessment:     Nexplanon removal Contraceptive management    Plan:     Use condoms x 4 weeks, keep clean and dry x 24 hours, no heavy lifting, keep steri strips on x 72 hours, Keep pressure dressing on x 24 hours. Follow up prn problems.   Rx Triphasil take 1 daily disp 1 pack with 3 refills Return in 3 months for ROS and BP check

## 2014-07-30 NOTE — Patient Instructions (Signed)
Use condoms x 4 weeks, keep clean and dry x 24 hours, no heavy lifting, keep steri strips on x 72 hours, Keep pressure dressing on x 24 hours. Follow up prn problems. Return un in 3 months for ROS Start OCs today

## 2014-09-02 ENCOUNTER — Encounter: Payer: Self-pay | Admitting: Adult Health

## 2014-11-04 ENCOUNTER — Telehealth: Payer: Self-pay | Admitting: Adult Health

## 2014-11-04 ENCOUNTER — Ambulatory Visit: Payer: Medicaid Other | Admitting: Adult Health

## 2014-11-04 NOTE — Telephone Encounter (Signed)
Spoke with pt. Pt has enough birth control to last 2 more weeks. She is having insurance issues, but thinks it will be resolved this week. Pt needs appt to follow up on birth control. I advised to schedule an appt for next week and if insurance issues have not resolved, she can call back and reschedule. Pt voiced understanding. Call transferred to front desk for appt. JSY

## 2014-11-14 ENCOUNTER — Telehealth: Payer: Self-pay | Admitting: Adult Health

## 2014-11-14 MED ORDER — LEVONORG-ETH ESTRAD TRIPHASIC PO TABS: 1.0000 | ORAL_TABLET | Freq: Every day | ORAL | Status: DC

## 2014-11-14 NOTE — Telephone Encounter (Signed)
Will refill OCs x 1 

## 2014-11-14 NOTE — Telephone Encounter (Signed)
Spoke with pt. Pt had to reschedule appt to next week but she needs a refill on birth control. She will run out Saturday. Can you refill med? Thanks!!

## 2014-11-14 NOTE — Telephone Encounter (Signed)
Left message x 1. JSY 

## 2014-11-15 ENCOUNTER — Ambulatory Visit: Payer: Medicaid Other | Admitting: Adult Health

## 2014-11-21 ENCOUNTER — Ambulatory Visit (INDEPENDENT_AMBULATORY_CARE_PROVIDER_SITE_OTHER): Payer: Medicaid Other | Admitting: Adult Health

## 2014-11-21 ENCOUNTER — Encounter: Payer: Self-pay | Admitting: Adult Health

## 2014-11-21 ENCOUNTER — Other Ambulatory Visit (HOSPITAL_COMMUNITY)
Admission: RE | Admit: 2014-11-21 | Discharge: 2014-11-21 | Disposition: A | Discharge status: Home / Self Care | Payer: Medicaid Other | Source: Ambulatory Visit | Attending: Adult Health | Admitting: Adult Health

## 2014-11-21 VITALS — BP 160/90 | HR 76 | Ht 69.0 in | Wt 219.0 lb

## 2014-11-21 DIAGNOSIS — I10 Essential (primary) hypertension: Secondary | ICD-10-CM

## 2014-11-21 DIAGNOSIS — Z01419 Encounter for gynecological examination (general) (routine) without abnormal findings: Secondary | ICD-10-CM

## 2014-11-21 DIAGNOSIS — Z1151 Encounter for screening for human papillomavirus (HPV): Secondary | ICD-10-CM | POA: Insufficient documentation

## 2014-11-21 DIAGNOSIS — Z308 Encounter for other contraceptive management: Secondary | ICD-10-CM

## 2014-11-21 DIAGNOSIS — Z3041 Encounter for surveillance of contraceptive pills: Secondary | ICD-10-CM

## 2014-11-21 HISTORY — DX: Essential (primary) hypertension: I10

## 2014-11-21 LAB — HEMOCCULT GUIAC POC 1CARD (OFFICE): Fecal Occult Blood, POC: NEGATIVE

## 2014-11-21 MED ORDER — LEVONORG-ETH ESTRAD TRIPHASIC PO TABS: 1.0000 | ORAL_TABLET | Freq: Every day | ORAL | Status: DC

## 2014-11-21 MED ORDER — HYDROCHLOROTHIAZIDE 12.5 MG PO CAPS
12.5000 mg | ORAL_CAPSULE | Freq: Every day | ORAL | Status: DC
Start: 2014-11-21 — End: 2015-11-27

## 2014-11-21 NOTE — Patient Instructions (Signed)
Start microzide  Physical in 1 year Mammogram yearly Recheck BP in 4 weeks Decrease salt and work on weight  Try Whole 30 Hypertension Hypertension, commonly called high blood pressure, is when the force of blood pumping through your arteries is too strong. Your arteries are the blood vessels that carry blood from your heart throughout your body. A blood pressure reading consists of a higher number over a lower number, such as 110/72. The higher number (systolic) is the pressure inside your arteries when your heart pumps. The lower number (diastolic) is the pressure inside your arteries when your heart relaxes. Ideally you want your blood pressure below 120/80. Hypertension forces your heart to work harder to pump blood. Your arteries may become narrow or stiff. Having hypertension puts you at risk for heart disease, stroke, and other problems.  RISK FACTORS Some risk factors for high blood pressure are controllable. Others are not.  Risk factors you cannot control include:   Race. You may be at higher risk if you are African American.  Age. Risk increases with age.  Gender. Men are at higher risk than women before age 48 years. After age 48, women are at higher risk than men. Risk factors you can control include:  Not getting enough exercise or physical activity.  Being overweight.  Getting too much fat, sugar, calories, or salt in your diet.  Drinking too much alcohol. SIGNS AND SYMPTOMS Hypertension does not usually cause signs or symptoms. Extremely high blood pressure (hypertensive crisis) may cause headache, anxiety, shortness of breath, and nosebleed. DIAGNOSIS  To check if you have hypertension, your health care provider will measure your blood pressure while you are seated, with your arm held at the level of your heart. It should be measured at least twice using the same arm. Certain conditions can cause a difference in blood pressure between your right and left arms. A blood  pressure reading that is higher than normal on one occasion does not mean that you need treatment. If one blood pressure reading is high, ask your health care provider about having it checked again. TREATMENT  Treating high blood pressure includes making lifestyle changes and possibly taking medicine. Living a healthy lifestyle can help lower high blood pressure. You may need to change some of your habits. Lifestyle changes may include:  Following the DASH diet. This diet is high in fruits, vegetables, and whole grains. It is low in salt, red meat, and added sugars.  Getting at least 2 hours of brisk physical activity every week.  Losing weight if necessary.  Not smoking.  Limiting alcoholic beverages.  Learning ways to reduce stress. If lifestyle changes are not enough to get your blood pressure under control, your health care provider may prescribe medicine. You may need to take more than one. Work closely with your health care provider to understand the risks and benefits. HOME CARE INSTRUCTIONS  Have your blood pressure rechecked as directed by your health care provider.   Take medicines only as directed by your health care provider. Follow the directions carefully. Blood pressure medicines must be taken as prescribed. The medicine does not work as well when you skip doses. Skipping doses also puts you at risk for problems.   Do not smoke.   Monitor your blood pressure at home as directed by your health care provider. SEEK MEDICAL CARE IF:   You think you are having a reaction to medicines taken.  You have recurrent headaches or feel dizzy.  You have  swelling in your ankles.  You have trouble with your vision. SEEK IMMEDIATE MEDICAL CARE IF:  You develop a severe headache or confusion.  You have unusual weakness, numbness, or feel faint.  You have severe chest or abdominal pain.  You vomit repeatedly.  You have trouble breathing. MAKE SURE YOU:   Understand  these instructions.  Will watch your condition.  Will get help right away if you are not doing well or get worse. Document Released: 10/18/2005 Document Revised: 03/04/2014 Document Reviewed: 08/10/2013 [facility] Patient Information 2015 [place], [place]. This information is not intended to replace advice given to you by your health care provider. Make sure you discuss any questions you have with your health care provider.

## 2014-11-21 NOTE — Progress Notes (Signed)
Patient ID: Brittany Scott, female   DOB: [birth year], 49 y.o.   MRN: [id number] History of Present Illness: Brittany Scott is a 49 year old black female in for pap and physical.She is happy with her pills. Has glaucoma.  Current Medications, Allergies, Past Medical History, Past Surgical History, Family History and Social History were reviewed in [facility] electronic medical record.     Review of Systems: Patient denies any daily headaches,( has one before period), shortness of breath, chest pain, abdominal pain, problems with bowel movements, urination, or intercourse. No joint pain or mood swings.    Physical Exam:BP 160/90 mmHg  Pulse 76  Ht 5\' 9"  (1.753 m)  Wt 219 lb (99.338 kg)  BMI 32.33 kg/m2  LMP 01/14/2016BP was 154/88 on arrival. General:  Well developed, well nourished, no acute distress Skin:  Warm and dry Neck:  Midline trachea, normal thyroid Lungs; Clear to auscultation bilaterally Breast:  No dominant palpable mass, retraction, or nipple discharge Cardiovascular: Regular rate and rhythm Abdomen:  Soft, non tender, no hepatosplenomegaly Pelvic:  External genitalia is normal in appearance,no lesions. The vagina is normal in appearance. The cervix is bulbous,pap with HPV peformed.  Uterus is felt to be normal size, shape, and contour.  No   adnexal masses or tenderness noted. Rectal: Good sphincter tone, no polyps, or hemorrhoids felt.  Hemoccult negative. Extremities:  No swelling or varicosities noted Psych:  No mood changes,alert and cooperative,seems happy   Impression: Well woman gyn exam with pap Hypertension Contraceptive management    Plan: Return in 4 weeks for BP check Physical in 1 year Mammogram yearly Refilled enpresse x 1 year Rx microzide 12.5 mg #30 1 daily with 11 refills Decrease salt and try to lose weight, try Whole 30 Check GC/CHL,HIV,RPR,HSV 2

## 2014-11-22 LAB — GC/CHLAMYDIA PROBE AMP
CT Probe RNA: NEGATIVE
GC PROBE AMP APTIMA: NEGATIVE

## 2014-11-22 LAB — HIV ANTIBODY (ROUTINE TESTING W REFLEX): HIV: NONREACTIVE

## 2014-11-22 LAB — RPR

## 2014-11-25 LAB — HSV 2 ANTIBODY, IGG: HSV 2 GLYCOPROTEIN G AB, IGG: 0.26 IV

## 2014-11-27 LAB — CYTOLOGY - PAP

## 2014-12-19 ENCOUNTER — Ambulatory Visit: Payer: Medicaid Other | Admitting: Adult Health

## 2014-12-23 ENCOUNTER — Encounter: Payer: Self-pay | Admitting: Adult Health

## 2014-12-23 ENCOUNTER — Ambulatory Visit (INDEPENDENT_AMBULATORY_CARE_PROVIDER_SITE_OTHER): Payer: Self-pay | Admitting: Adult Health

## 2014-12-23 VITALS — BP 140/84 | Ht 69.0 in | Wt 213.0 lb

## 2014-12-23 DIAGNOSIS — I10 Essential (primary) hypertension: Secondary | ICD-10-CM

## 2014-12-23 NOTE — Progress Notes (Signed)
Subjective:     Patient ID: Brittany BougieLesley C Cubbage, female   DOB: November 02, 1965, 49 y.o.   MRN: [id number]  HPI Cathlean CowerLesley is a 49 year old black female in for BP check and weight check.She is feeling good and has lost about 6 lbs.  Review of Systems Patient denies any headaches, hearing loss, fatigue, blurred vision, shortness of breath, chest pain, abdominal pain, problems with bowel movements, urination, or intercourse. No joint pain or mood swings. Reviewed past medical,surgical, social and family history. Reviewed medications and allergies.     Objective:   Physical Exam BP 140/84 mmHg  Ht 5\' 9"  (1.753 m)  Wt 213 lb (96.616 kg)  BMI 31.44 kg/m2  LMP 12/16/2014   Skin warm and dry.  Lungs: clear to ausculation bilaterally. Cardiovascular: regular rate and rhythm.  Assessment:     Hypertension     Plan:    Continue weight loss efforts Continue microzide Follow up in 6 months

## 2014-12-23 NOTE — Patient Instructions (Signed)
Continue microzide and weight loss  Follow up in 6 months

## 2015-11-17 ENCOUNTER — Other Ambulatory Visit: Payer: Self-pay | Admitting: Adult Health

## 2015-11-27 ENCOUNTER — Ambulatory Visit (INDEPENDENT_AMBULATORY_CARE_PROVIDER_SITE_OTHER): Payer: Medicaid Other | Admitting: Adult Health

## 2015-11-27 ENCOUNTER — Encounter: Payer: Self-pay | Admitting: Adult Health

## 2015-11-27 VITALS — BP 140/90 | HR 70 | Ht 67.5 in | Wt 217.0 lb

## 2015-11-27 DIAGNOSIS — Z113 Encounter for screening for infections with a predominantly sexual mode of transmission: Secondary | ICD-10-CM

## 2015-11-27 DIAGNOSIS — Z309 Encounter for contraceptive management, unspecified: Secondary | ICD-10-CM

## 2015-11-27 DIAGNOSIS — Z01419 Encounter for gynecological examination (general) (routine) without abnormal findings: Secondary | ICD-10-CM

## 2015-11-27 DIAGNOSIS — I10 Essential (primary) hypertension: Secondary | ICD-10-CM

## 2015-11-27 DIAGNOSIS — Z3041 Encounter for surveillance of contraceptive pills: Secondary | ICD-10-CM

## 2015-11-27 LAB — HEMOCCULT GUIAC POC 1CARD (OFFICE): Fecal Occult Blood, POC: NEGATIVE

## 2015-11-27 MED ORDER — HYDROCHLOROTHIAZIDE 12.5 MG PO CAPS: 12.5000 mg | ORAL_CAPSULE | Freq: Every day | ORAL | Status: DC

## 2015-11-27 MED ORDER — LEVONORG-ETH ESTRAD TRIPHASIC PO TABS: 1.0000 | ORAL_TABLET | Freq: Every day | ORAL | Status: DC

## 2015-11-27 NOTE — Patient Instructions (Signed)
Physical in 1 year Mammogram yearly  

## 2015-11-27 NOTE — Progress Notes (Signed)
Patient ID: Brittany Scott, female   DOB: August 04, 1966, 50 y.o.   MRN: [id number] History of Present Illness: Brittany Scott is a 50 year old black female in for a well woman gyn exam and get refills on OCs.She had a normal pap with negative HPV 11/21/14.She says her weight has reached a plateau, she is going to the gym and does spin class and watching her carbs, and can't lose. She has glaucoma and goes to [facility] but next month sees new doctor in [place].  Current Medications, Allergies, Past Medical History, Past Surgical History, Family History and Social History were reviewed in [facility] record.     Review of Systems: Patient denies any headaches, hearing loss, fatigue, blurred vision, shortness of breath, chest pain, abdominal pain, problems with bowel movements, urination, or intercourse. No joint pain or mood swings.    Physical Exam:BP 140/90 mmHg  Pulse 70  Ht 5' 7.5" (1.715 m)  Wt 217 lb (98.431 kg)  BMI 33.47 kg/m2  LMP 11/11/2015 General:  Well developed, well nourished, no acute distress Skin:  Warm and dry Neck:  Midline trachea, normal thyroid, good ROM, no lymphadenopathy Lungs; Clear to auscultation bilaterally Breast:  No dominant palpable mass, retraction, or nipple discharge Cardiovascular: Regular rate and rhythm Abdomen:  Soft, non tender, no hepatosplenomegaly Pelvic:  External genitalia is normal in appearance, no lesions.  The vagina is normal in appearance. Urethra has no lesions or masses. The cervix is bulbous. GC/CHL probe obtained. Uterus is felt to be normal size, shape, and contour.  No adnexal masses or tenderness noted.Bladder is non tender, no masses felt. Rectal: Good sphincter tone, no polyps, or hemorrhoids felt.  Hemoccult negative. Extremities/musculoskeletal:  No swelling or varicosities noted, no clubbing or cyanosis Psych:  No mood changes, alert and cooperative,seems happy Discussed decreasing smoothies and increasing whole  veggies and fruits, do weight training 2 day and cardio 3 days and whatever is fun on 6th day.Cut portion size and increase water.   Impression: Well woman gyn exam no pap Contraceptive management Hypertension STD screening    Plan: GC/CHL sent Check HIV,RPR and HSV 2 Refilled microzide 12.5 mg x 1 year, take 1 daily disp #30 Refilled enpresse x 1 year, take 1 daily disp 1 pack Physical in 1 year Mammogram yearly

## 2015-11-28 LAB — HIV ANTIBODY (ROUTINE TESTING W REFLEX): HIV Screen 4th Generation wRfx: NONREACTIVE

## 2015-11-28 LAB — RPR: RPR: NONREACTIVE

## 2015-11-28 LAB — HSV 2 ANTIBODY, IGG: HSV 2 Glycoprotein G Ab, IgG: <0.91 index (ref 0.00–0.90)

## 2015-11-30 LAB — GC/CHLAMYDIA PROBE AMP
Chlamydia trachomatis, NAA: NEGATIVE
Neisseria gonorrhoeae by PCR: NEGATIVE

## 2016-11-16 ENCOUNTER — Other Ambulatory Visit: Payer: Self-pay | Admitting: Adult Health

## 2016-11-30 ENCOUNTER — Other Ambulatory Visit: Payer: Medicaid Other | Admitting: Adult Health

## 2016-12-09 ENCOUNTER — Other Ambulatory Visit: Payer: Medicaid Other | Admitting: Adult Health

## 2016-12-15 ENCOUNTER — Encounter: Payer: Self-pay | Admitting: Adult Health

## 2016-12-15 ENCOUNTER — Ambulatory Visit (INDEPENDENT_AMBULATORY_CARE_PROVIDER_SITE_OTHER): Payer: Medicaid Other | Admitting: Adult Health

## 2016-12-15 VITALS — BP 152/100 | HR 60 | Ht 67.75 in | Wt 201.6 lb

## 2016-12-15 DIAGNOSIS — Z3009 Encounter for other general counseling and advice on contraception: Secondary | ICD-10-CM

## 2016-12-15 DIAGNOSIS — Z308 Encounter for other contraceptive management: Secondary | ICD-10-CM | POA: Diagnosis not present

## 2016-12-15 DIAGNOSIS — Z1211 Encounter for screening for malignant neoplasm of colon: Secondary | ICD-10-CM

## 2016-12-15 DIAGNOSIS — R61 Generalized hyperhidrosis: Secondary | ICD-10-CM

## 2016-12-15 DIAGNOSIS — I10 Essential (primary) hypertension: Secondary | ICD-10-CM

## 2016-12-15 DIAGNOSIS — Z3041 Encounter for surveillance of contraceptive pills: Secondary | ICD-10-CM

## 2016-12-15 DIAGNOSIS — Z01419 Encounter for gynecological examination (general) (routine) without abnormal findings: Secondary | ICD-10-CM

## 2016-12-15 DIAGNOSIS — Z1212 Encounter for screening for malignant neoplasm of rectum: Secondary | ICD-10-CM

## 2016-12-15 LAB — HEMOCCULT GUIAC POC 1CARD (OFFICE): Fecal Occult Blood, POC: NEGATIVE

## 2016-12-15 MED ORDER — HYDROCHLOROTHIAZIDE 12.5 MG PO CAPS: 12.5000 mg | ORAL_CAPSULE | Freq: Every day | ORAL | 11 refills | Status: DC

## 2016-12-15 MED ORDER — NORETHIN-ETH ESTRAD-FE BIPHAS 1 MG-10 MCG / 10 MCG PO TABS: 1.0000 | ORAL_TABLET | Freq: Every day | ORAL | 11 refills | Status: DC

## 2016-12-15 NOTE — Progress Notes (Signed)
Patient ID: Brittany Scott, female   DOB: June 05, 1966, 51 y.o.   MRN: [id number] History of Present Illness: Brittany Scott is a 51 year old black female in for a well woman gyn exam, she had a normal pap 11/21/14 with negative HPV.    Current Medications, Allergies, Past Medical History, Past Surgical History, Family History and Social History were reviewed in [facility] electronic medical record.     Review of Systems:  Patient denies any headaches, hearing loss, fatigue, blurred vision, shortness of breath, chest pain, abdominal pain, problems with bowel movements, urination, or intercourse. No joint pain or mood swings.+night sweats esp around period  Physical Exam:BP (!) 152/100 (BP Location: Right Arm, Patient Position: Sitting, Cuff Size: Large)   Pulse 60   Ht 5' 7.75" (1.721 m)   Wt 201 lb 9.6 oz (91.4 kg)   LMP 12/09/2016 (Exact Date)   BMI 30.88 kg/m  General:  Well developed, well nourished, no acute distress Skin:  Warm and dry Neck:  Midline trachea, normal thyroid, good ROM, no lymphadenopathy Lungs; Clear to auscultation bilaterally Breast:  No dominant palpable mass, retraction, or nipple discharge Cardiovascular: Regular rate and rhythm Abdomen:  Soft, non tender, no hepatosplenomegaly Pelvic:  External genitalia is normal in appearance, no lesions.  The vagina is normal in appearance. Urethra has no lesions or masses. The cervix is bulbous.  Uterus is felt to be normal size, shape, and contour.  No adnexal masses or tenderness noted.Bladder is non tender, no masses felt.GC/CHL obtained. Rectal: Good sphincter tone, no polyps, or hemorrhoids felt.  Hemoccult negative. Extremities/musculoskeletal:  No swelling or varicosities noted, no clubbing or cyanosis Psych:  No mood changes, alert and cooperative,seems happy PHQ 2 score 1.Will change OCs to 26 day pill to see if helps.  Impression: 1. Encounter for gynecological examination without abnormal finding   2. Encounter  for surveillance of contraceptive pills   3. Essential hypertension   4. Family planning   5. Night sweats   6. Screening for colorectal cancer       Plan: Meds ordered this encounter  Medications  . Norethindrone-Ethinyl Estradiol-Fe Biphas (LO LOESTRIN FE) 1 MG-10 MCG / 10 MCG tablet    Sig: Take 1 tablet by mouth daily. Take 1 daily by mouth    Dispense:  1 Package    Refill:  11    BIN [id number], PCN [id number], GRP [id number],ID [id number]    Order Specific Question:   Supervising Provider    Answer:   Duane LopeEURE, LUTHER H [[id number]]  . hydrochlorothiazide (MICROZIDE) 12.5 MG capsule    Sig: Take 1 capsule (12.5 mg total) by mouth daily.    Dispense:  30 capsule    Refill:  11    Order Specific Question:   Supervising Provider    Answer:   Lazaro ArmsEURE, LUTHER H [[id number]]  GC/CHL sent Check HIV and RPR Follow up in 8 weeks Physical in 1 year Pap in 2019 Mammogram yearly

## 2016-12-16 LAB — RPR: RPR Ser Ql: NONREACTIVE

## 2016-12-16 LAB — HIV ANTIBODY (ROUTINE TESTING W REFLEX): HIV SCREEN 4TH GENERATION: NONREACTIVE

## 2016-12-17 LAB — GC/CHLAMYDIA PROBE AMP
CHLAMYDIA, DNA PROBE: NEGATIVE
Neisseria gonorrhoeae by PCR: NEGATIVE

## 2017-02-09 ENCOUNTER — Ambulatory Visit: Payer: Medicaid Other | Admitting: Adult Health

## 2017-11-16 ENCOUNTER — Other Ambulatory Visit: Payer: Self-pay | Admitting: Adult Health

## 2017-12-16 ENCOUNTER — Other Ambulatory Visit: Payer: Self-pay | Admitting: Adult Health

## 2017-12-28 ENCOUNTER — Other Ambulatory Visit (HOSPITAL_COMMUNITY)
Admission: RE | Admit: 2017-12-28 | Discharge: 2017-12-28 | Disposition: A | Discharge status: Home / Self Care | Payer: BLUE CROSS/BLUE SHIELD | Source: Ambulatory Visit | Attending: Adult Health | Admitting: Adult Health

## 2017-12-28 ENCOUNTER — Ambulatory Visit (INDEPENDENT_AMBULATORY_CARE_PROVIDER_SITE_OTHER): Payer: BLUE CROSS/BLUE SHIELD | Admitting: Adult Health

## 2017-12-28 ENCOUNTER — Other Ambulatory Visit: Payer: Self-pay

## 2017-12-28 ENCOUNTER — Encounter: Payer: Self-pay | Admitting: Adult Health

## 2017-12-28 VITALS — BP 122/84 | HR 69 | Resp 18 | Ht 69.0 in | Wt 215.0 lb

## 2017-12-28 DIAGNOSIS — Z1211 Encounter for screening for malignant neoplasm of colon: Secondary | ICD-10-CM | POA: Diagnosis not present

## 2017-12-28 DIAGNOSIS — Z113 Encounter for screening for infections with a predominantly sexual mode of transmission: Secondary | ICD-10-CM

## 2017-12-28 DIAGNOSIS — Z01411 Encounter for gynecological examination (general) (routine) with abnormal findings: Secondary | ICD-10-CM

## 2017-12-28 DIAGNOSIS — Z1212 Encounter for screening for malignant neoplasm of rectum: Secondary | ICD-10-CM | POA: Diagnosis not present

## 2017-12-28 DIAGNOSIS — Z3009 Encounter for other general counseling and advice on contraception: Secondary | ICD-10-CM | POA: Diagnosis present

## 2017-12-28 DIAGNOSIS — Z3041 Encounter for surveillance of contraceptive pills: Secondary | ICD-10-CM

## 2017-12-28 DIAGNOSIS — I10 Essential (primary) hypertension: Secondary | ICD-10-CM | POA: Diagnosis not present

## 2017-12-28 DIAGNOSIS — Z01419 Encounter for gynecological examination (general) (routine) without abnormal findings: Secondary | ICD-10-CM

## 2017-12-28 LAB — HEMOCCULT GUIAC POC 1CARD (OFFICE): Fecal Occult Blood, POC: NEGATIVE

## 2017-12-28 MED ORDER — HYDROCHLOROTHIAZIDE 12.5 MG PO CAPS: 12.5000 mg | ORAL_CAPSULE | Freq: Every day | ORAL | 12 refills | Status: DC

## 2017-12-28 MED ORDER — NORETHIN-ETH ESTRAD-FE BIPHAS 1 MG-10 MCG / 10 MCG PO TABS
1.0000 | ORAL_TABLET | Freq: Every day | ORAL | 12 refills | Status: DC
Start: 2017-12-28 — End: 2020-10-08

## 2017-12-28 NOTE — Progress Notes (Signed)
Patient ID: Brittany Scott, female   DOB: 1966-01-25, 52 y.o.   MRN: [id number] History of Present Illness:  Brittany Scott is a 52 year old black female in for a well woman gyn exam and pap. She has Family planning medicaid.   Current Medications, Allergies, Past Medical History, Past Surgical History, Family History and Social History were reviewed in [facility] electronic medical record.     Review of Systems: Patient denies any daily headaches, hearing loss, fatigue, blurred vision, shortness of breath, chest pain, abdominal pain, problems with bowel movements, urination, or intercourse. No joint pain or mood swings.    Physical Exam:BP 122/84 (BP Location: Right Arm, Patient Position: Sitting, Cuff Size: Large)   Pulse 69   Resp 18   Ht 5\' 9"  (1.753 m)   Wt 215 lb (97.5 kg)   LMP 11/20/2017   BMI 31.75 kg/m  General:  Well developed, well nourished, no acute distress Skin:  Warm and dry Neck:  Midline trachea, normal thyroid, good ROM, no lymphadenopathy Lungs; Clear to auscultation bilaterally Breast:  No dominant palpable mass, retraction, or nipple discharge Cardiovascular: Regular rate and rhythm Abdomen:  Soft, non tender, no hepatosplenomegaly Pelvic:  External genitalia is normal in appearance, no lesions.  The vagina is normal in appearance. Urethra has no lesions or masses. The cervix is bulbous, with several nabothian cysts, pap with HPV and GC/CHL performed.  Uterus is felt to be normal size, shape, and contour.  No adnexal masses or tenderness noted.Bladder is non tender, no masses felt. Rectal: Good sphincter tone, no polyps, or hemorrhoids felt.  Hemoccult negative. Extremities/musculoskeletal:  No swelling or varicosities noted, no clubbing or cyanosis Psych:  No mood changes, alert and cooperative,seems happy PHQ 2 score 0. Will stay on OCs till 6552 then stop for 1 month and then check FSH.   Impression: 1. Encounter for gynecological examination with  Papanicolaou smear of cervix   2. Family planning   3. Encounter for surveillance of contraceptive pills   4. Essential hypertension   5. Screening for colorectal cancer   6. Screening examination for STD (sexually transmitted disease)       Plan: Check HIV and RPR [facility] call about mammogram Physical in 1 year Pap in 3 if normal Meds ordered this encounter  Medications  . Norethindrone-Ethinyl Estradiol-Fe Biphas (LO LOESTRIN FE) 1 MG-10 MCG / 10 MCG tablet    Sig: Take 1 tablet by mouth daily.    Dispense:  28 tablet    Refill:  12    Order Specific Question:   Supervising Provider    Answer:   Brittany Scott [[id number]]  . hydrochlorothiazide (MICROZIDE) 12.5 MG capsule    Sig: Take 1 capsule (12.5 mg total) by mouth daily.    Dispense:  30 capsule    Refill:  12    PATIENT REQUESTS REFILLS PLEASE AND THANK YOU    Order Specific Question:   Supervising Provider    Answer:   Brittany Scott [[id number]]

## 2017-12-30 LAB — CYTOLOGY - PAP
Adequacy: ABSENT
CHLAMYDIA, DNA PROBE: NEGATIVE
DIAGNOSIS: NEGATIVE
HPV: NOT DETECTED
Neisseria Gonorrhea: NEGATIVE

## 2019-01-12 ENCOUNTER — Other Ambulatory Visit: Payer: Self-pay | Admitting: Adult Health

## 2020-01-25 ENCOUNTER — Other Ambulatory Visit: Payer: Self-pay | Admitting: Adult Health

## 2020-05-18 ENCOUNTER — Other Ambulatory Visit: Payer: Self-pay | Admitting: Adult Health

## 2020-10-08 ENCOUNTER — Ambulatory Visit (INDEPENDENT_AMBULATORY_CARE_PROVIDER_SITE_OTHER): Payer: Managed Care, Other (non HMO) | Admitting: Adult Health

## 2020-10-08 ENCOUNTER — Other Ambulatory Visit: Payer: Self-pay

## 2020-10-08 ENCOUNTER — Encounter: Payer: Self-pay | Admitting: Adult Health

## 2020-10-08 VITALS — BP 158/91 | HR 55 | Ht 69.0 in | Wt 212.5 lb

## 2020-10-08 DIAGNOSIS — R350 Frequency of micturition: Secondary | ICD-10-CM | POA: Diagnosis not present

## 2020-10-08 DIAGNOSIS — R300 Dysuria: Secondary | ICD-10-CM | POA: Insufficient documentation

## 2020-10-08 LAB — POCT URINALYSIS DIPSTICK OB
Blood, UA: NEGATIVE
Glucose, UA: NEGATIVE
Ketones, UA: NEGATIVE
Leukocytes, UA: NEGATIVE
Nitrite, UA: NEGATIVE
POC,PROTEIN,UA: NEGATIVE

## 2020-10-08 MED ORDER — SULFAMETHOXAZOLE-TRIMETHOPRIM 800-160 MG PO TABS: 1.0000 | ORAL_TABLET | Freq: Two times a day (BID) | ORAL | 0 refills | Status: AC

## 2020-10-08 MED ORDER — PHENAZOPYRIDINE HCL 200 MG PO TABS: 200.0000 mg | ORAL_TABLET | Freq: Three times a day (TID) | ORAL | 0 refills | Status: AC | PRN

## 2020-10-08 MED ORDER — FLUCONAZOLE 150 MG PO TABS: ORAL_TABLET | ORAL | 1 refills | Status: AC

## 2020-10-08 NOTE — Progress Notes (Signed)
  Subjective:     Patient ID: Brittany Scott, female   DOB: 15-Nov-1965, 54 y.o.   MRN: [id number]  HPI Brittany Scott is a 54 year old black female,married, PM in complaining of burnign with urination and urinary frequency, started today.   Review of Systems Burning with urination,started today Urinary frequency  Denies any fever  Reviewed past medical,surgical, social and family history. Reviewed medications and allergies.     Objective:   Physical Exam BP (!) 158/91 (BP Location: Left Arm, Patient Position: Sitting, Cuff Size: Normal)   Pulse (!) 55   Ht 5\' 9"  (1.753 m)   Wt 212 lb 8 oz (96.4 kg)   LMP 11/20/2017   BMI 31.38 kg/m  urine dipstick was negative. Skin warm and dry. Lungs: clear to ausculation bilaterally. Cardiovascular: regular rate and rhythm.   No CVAT.  Upstream - 10/08/20 1542      Pregnancy Intention Screening   Does the patient want to become pregnant in the next year? N/A    Does the patient's partner want to become pregnant in the next year? N/A    Would the patient like to discuss contraceptive options today? N/A      Contraception Wrap Up   Current Method No Contraceptive Precautions   PM   End Method No Contraception Precautions   PM   Contraception Counseling Provided No          Assessment:     1. Urinary frequency UA C& S sent   2. Burning with urination Will rx septra ds and pyridium and she request diflucan  Meds ordered this encounter  Medications  . sulfamethoxazole-trimethoprim (BACTRIM DS) 800-160 MG tablet    Sig: Take 1 tablet by mouth 2 (two) times daily. Take 1 bid    Dispense:  14 tablet    Refill:  0    Order Specific Question:   Supervising Provider    Answer:   14/08/21, LUTHER H [[id number]]  . phenazopyridine (PYRIDIUM) 200 MG tablet    Sig: Take 1 tablet (200 mg total) by mouth 3 (three) times daily as needed for pain.    Dispense:  10 tablet    Refill:  0    Order Specific Question:   Supervising Provider    Answer:   Despina Hidden,  LUTHER H [[id number]]  . fluconazole (DIFLUCAN) 150 MG tablet    Sig: Take 1 now and 1 in 3 days if needed    Dispense:  2 tablet    Refill:  1    Order Specific Question:   Supervising Provider    Answer:   Despina Hidden [[id number]]      Plan:    Push fluids  Return in 3 months for pap and physical

## 2020-10-09 LAB — URINALYSIS, ROUTINE W REFLEX MICROSCOPIC
Bilirubin, UA: NEGATIVE
Glucose, UA: NEGATIVE
Ketones, UA: NEGATIVE
Leukocytes,UA: NEGATIVE
Nitrite, UA: NEGATIVE
Protein,UA: NEGATIVE
RBC, UA: NEGATIVE
Specific Gravity, UA: 1.005 (ref 1.005–1.030)
Urobilinogen, Ur: 0.2 mg/dL (ref 0.2–1.0)
pH, UA: 7 (ref 5.0–7.5)

## 2020-10-10 LAB — URINE CULTURE

## 2021-01-06 ENCOUNTER — Other Ambulatory Visit: Payer: 59 | Admitting: Adult Health

## 2024-09-10 ENCOUNTER — Encounter (HOSPITAL_BASED_OUTPATIENT_CLINIC_OR_DEPARTMENT_OTHER): Payer: Self-pay | Admitting: Emergency Medicine

## 2024-09-10 ENCOUNTER — Other Ambulatory Visit: Payer: Self-pay

## 2024-09-10 ENCOUNTER — Emergency Department (HOSPITAL_BASED_OUTPATIENT_CLINIC_OR_DEPARTMENT_OTHER)
Admission: EM | Admit: 2024-09-10 | Discharge: 2024-09-10 | Disposition: A | Discharge status: Home / Self Care | Attending: Emergency Medicine | Admitting: Emergency Medicine

## 2024-09-10 DIAGNOSIS — I160 Hypertensive urgency: Secondary | ICD-10-CM | POA: Insufficient documentation

## 2024-09-10 DIAGNOSIS — R682 Dry mouth, unspecified: Secondary | ICD-10-CM | POA: Insufficient documentation

## 2024-09-10 DIAGNOSIS — R439 Unspecified disturbances of smell and taste: Secondary | ICD-10-CM | POA: Diagnosis not present

## 2024-09-10 DIAGNOSIS — R432 Parageusia: Secondary | ICD-10-CM | POA: Diagnosis present

## 2024-09-10 DIAGNOSIS — R438 Other disturbances of smell and taste: Secondary | ICD-10-CM

## 2024-09-10 DIAGNOSIS — I10 Essential (primary) hypertension: Secondary | ICD-10-CM | POA: Insufficient documentation

## 2024-09-10 LAB — CBC WITH DIFFERENTIAL/PLATELET
Abs Immature Granulocytes: 0.01 K/uL (ref 0.00–0.07)
Basophils Absolute: 0 K/uL (ref 0.0–0.1)
Basophils Relative: 0 %
Eosinophils Absolute: 0.2 K/uL (ref 0.0–0.5)
Eosinophils Relative: 3 %
HCT: 44.5 % (ref 36.0–46.0)
Hemoglobin: 14.6 g/dL (ref 12.0–15.0)
Immature Granulocytes: 0 %
Lymphocytes Relative: 47 %
Lymphs Abs: 2.9 K/uL (ref 0.7–4.0)
MCH: 27.8 pg (ref 26.0–34.0)
MCHC: 32.8 g/dL (ref 30.0–36.0)
MCV: 84.6 fL (ref 80.0–100.0)
Monocytes Absolute: 0.4 K/uL (ref 0.1–1.0)
Monocytes Relative: 7 %
Neutro Abs: 2.6 K/uL (ref 1.7–7.7)
Neutrophils Relative %: 43 %
Platelets: 270 K/uL (ref 150–400)
RBC: 5.26 MIL/uL — ABNORMAL HIGH (ref 3.87–5.11)
RDW: 13.2 % (ref 11.5–15.5)
WBC: 6.1 K/uL (ref 4.0–10.5)
nRBC: 0 % (ref 0.0–0.2)

## 2024-09-10 LAB — BASIC METABOLIC PANEL WITH GFR
Anion gap: 11 (ref 5–15)
BUN: 8 mg/dL (ref 6–20)
CO2: 26 mmol/L (ref 22–32)
Calcium: 10 mg/dL (ref 8.9–10.3)
Chloride: 103 mmol/L (ref 98–111)
Creatinine, Ser: 0.7 mg/dL (ref 0.44–1.00)
GFR, Estimated: >60 mL/min (ref >60)
Glucose, Bld: 93 mg/dL (ref 70–99)
Potassium: 4 mmol/L (ref 3.5–5.1)
Sodium: 141 mmol/L (ref 135–145)

## 2024-09-10 LAB — RESP PANEL BY RT-PCR (RSV, FLU A&B, COVID)  RVPGX2
Influenza A by PCR: NEGATIVE
Influenza B by PCR: NEGATIVE
Resp Syncytial Virus by PCR: NEGATIVE
SARS Coronavirus 2 by RT PCR: NEGATIVE

## 2024-09-10 LAB — CBG MONITORING, ED: Glucose-Capillary: 97 mg/dL (ref 70–99)

## 2024-09-10 MED ORDER — NYSTATIN 100000 UNIT/ML MT SUSP: 500000.0000 [IU] | Freq: Four times a day (QID) | OROMUCOSAL | 0 refills | Status: AC

## 2024-09-10 MED ORDER — AMLODIPINE BESYLATE 5 MG PO TABS
5.0000 mg | ORAL_TABLET | Freq: Once | ORAL | Status: AC
  Administered 2024-09-10: 5 mg via ORAL
  Filled 2024-09-10: qty 1

## 2024-09-10 MED ORDER — AMLODIPINE BESYLATE 5 MG PO TABS
5.0000 mg | ORAL_TABLET | Freq: Once | ORAL | Status: DC
  Filled 2024-09-10: qty 1

## 2024-09-10 MED ORDER — NYSTATIN 100000 UNIT/ML MT SUSP: 500000.0000 [IU] | Freq: Four times a day (QID) | OROMUCOSAL | 0 refills | Status: DC

## 2024-09-10 MED ORDER — AMLODIPINE BESYLATE 5 MG PO TABS: 5.0000 mg | ORAL_TABLET | Freq: Every day | ORAL | 0 refills | Status: AC

## 2024-09-10 NOTE — ED Provider Notes (Signed)
 [place] EMERGENCY DEPARTMENT AT [facility] Provider Note   CSN: [id number] Arrival date & time: 09/10/24  1605     Patient presents with: Loss of Taste   Brittany Scott is a 58 y.o. female.  HPI Patient is a 58 year old female was in the ED today for concerns for 2-week history of metallic taste in mouth, dry mouth, and loss of taste.  Concerned that she may have thrush.  Reported that she did have a crown recently come off and has a scheduled appoint with dentist.  Previous medical history of HTN but is not taking Brittany medications at this time.  Denies heavy metal exposure.  Denies alcohol use, drug use.  Denies fever, headache, vision changes, cough, congestion, rhinorrhea, dysphagia, odynophagia, tinnitus, chest pain, shortness of breath, abdominal pain, nausea, vomiting, diarrhea, dysuria, hematuria, melena, hematochezia, lower leg swelling, rashes.      Prior to Admission medications   Medication Sig Start Date End Date Taking? Authorizing Provider  amLODipine (NORVASC) 5 MG tablet Take 1 tablet (5 mg total) by mouth daily. 09/10/24  Yes Beola Terrall RAMAN, PA-C  fluconazole  (DIFLUCAN ) 150 MG tablet Take 1 now and 1 in 3 days if needed 10/08/20   Signa Delon LABOR, NP  hydrochlorothiazide  (MICROZIDE ) 12.5 MG capsule TAKE 1 CAPSULE BY MOUTH EVERY DAY 05/19/20   Signa Delon A, NP  latanoprost (XALATAN) 0.005 % ophthalmic solution 1 drop at bedtime.    [provider]  nystatin (MYCOSTATIN) 100000 UNIT/ML suspension Take 5 mLs (500,000 Units total) by mouth 4 (four) times daily. 09/10/24   Jode Lippe S, PA-C  phenazopyridine  (PYRIDIUM ) 200 MG tablet Take 1 tablet (200 mg total) by mouth 3 (three) times daily as needed for pain. 10/08/20   Signa Delon LABOR, NP  sulfamethoxazole -trimethoprim  (BACTRIM  DS) 800-160 MG tablet Take 1 tablet by mouth 2 (two) times daily. Take 1 bid 10/08/20   Griffin, Jennifer A, NP  timolol (TIMOPTIC) 0.25 % ophthalmic  solution Place 1 drop into both eyes 2 (two) times daily.    [provider]    Allergies: Patient has no known allergies.    Review of Systems  HENT:         Loss of taste  All other systems reviewed and are negative.   Updated Vital Signs BP (!) 174/97 (BP Location: Left Arm)   Pulse 63   Temp 98.3 F (36.8 C) (Oral)   Resp 18   LMP 11/20/2017   SpO2 100%   Physical Exam Vitals and nursing note reviewed.  Constitutional:      General: She is not in acute distress.    Appearance: Normal appearance. She is not ill-appearing or diaphoretic.  HENT:     Head: Normocephalic and atraumatic.     Mouth/Throat:     Mouth: Mucous membranes are moist. No injury, lacerations, oral lesions or angioedema.     Dentition: Abnormal dentition. Dental caries present. No gingival swelling, dental abscesses or gum lesions.     Tongue: No lesions. Tongue does not deviate from midline.     Palate: No mass and lesions.     Pharynx: Oropharynx is clear. Uvula midline. No pharyngeal swelling, oropharyngeal exudate, posterior oropharyngeal erythema or uvula swelling.     Tonsils: No tonsillar exudate or tonsillar abscesses. 0 on the right. 0 on the left.     Comments: Noted to have missing crown to left lower dentition. Eyes:     General: No scleral icterus.  Right eye: No discharge.        Left eye: No discharge.     Extraocular Movements: Extraocular movements intact.     Conjunctiva/sclera: Conjunctivae normal.  Cardiovascular:     Rate and Rhythm: Normal rate and regular rhythm.     Pulses: Normal pulses.     Heart sounds: Normal heart sounds. No murmur heard.    No friction rub. No gallop.  Pulmonary:     Effort: Pulmonary effort is normal. No respiratory distress.     Breath sounds: No stridor. No wheezing, rhonchi or rales.  Chest:     Chest wall: No tenderness.  Abdominal:     General: Abdomen is flat. There is no distension.     Palpations: Abdomen is soft.      Tenderness: There is no abdominal tenderness. There is no right CVA tenderness, left CVA tenderness, guarding or rebound.  Musculoskeletal:        General: No swelling, deformity or signs of injury.     Cervical back: Normal range of motion. No rigidity.     Right lower leg: No edema.     Left lower leg: No edema.  Skin:    General: Skin is warm and dry.     Findings: No bruising, erythema or lesion.  Neurological:     General: No focal deficit present.     Mental Status: She is alert and oriented to person, place, and time. Mental status is at baseline.     Sensory: No sensory deficit.     Motor: No weakness.     Coordination: Coordination normal.  Psychiatric:        Mood and Affect: Mood normal.     (all labs ordered are listed, but only abnormal results are displayed) Labs Reviewed  CBC WITH DIFFERENTIAL/PLATELET - Abnormal; Notable for the following components:      Result Value   RBC 5.26 (*)    All other components within normal limits  RESP PANEL BY RT-PCR (RSV, FLU A&B, COVID)  [id number]  BASIC METABOLIC PANEL WITH GFR  CBG MONITORING, ED    EKG: None  Radiology: No results found.  Procedures   Medications Ordered in the ED  amLODipine (NORVASC) tablet 5 mg (5 mg Oral Given 09/10/24 1837)    Medical Decision Making Amount and/or Complexity of Data Reviewed Labs: ordered.  Risk Prescription drug management.   This patient is a 58 year old female who presents to the ED for concern of dry mouth, metallic taste in mouth, loss of taste x 2 weeks.  Denies Brittany other symptoms at this time.  Concerned that she may have thrush.  Wishing to be treated for thrush.  On physical exam, patient is in no acute distress, afebrile, alert and orient x 4, speaking in full sentences, nontachypneic, nontachycardic.  Noted to have had missing crown to left lower dentition with dental caries noted.  Does not have diffuse disease.  No lesions noted to tongue, posterior oropharynx,  sublingual.  Normal neuroexam with no focal deficits.  Unremarkable exam.  Notably is in hypertensive urgency with not taking a blood pressure medications at this time, reported that she has whitecoat hypertension.  Lab work is unremarkable.  Respiratory panel unremarkable.  Low suspicion for Brittany emergent etiology as cause of patient's and today.  Patient is adamant that she had seen some white lesions on her tongue and wishes to be treated for thrush.  With patient's current symptoms, believe this is reasonable and will provide  nystatin and as well as have her continue follow-up with PCP.  Despite no signs of active infection at this time.    Will have her continue to follow-up with PCP.  Also provided amlodipine for her further hypertensive urgency and will have her continue to follow-up with PCP for further blood pressure management.  Patient vital signs have remained stable throughout the course of patient's time in the ED. Low suspicion for Brittany other emergent pathology at this time. I believe this patient is safe to be discharged. Provided strict return to ER precautions. Patient expressed agreement and understanding of plan. All questions were answered.  Differential diagnoses prior to evaluation: The emergent differential diagnosis includes, but is not limited to, medication side effect, URI, xerostomia, oral candidiasis, sinusitis, rhinitis, gingivitis, heavy metal poisoning, DKA, malignancy. This is not an exhaustive differential.   Past Medical History / Co-morbidities / Social History: Glaucoma, HTN Status post cholecystectomy  Additional history: Chart reviewed. Pertinent results include:   According to our charts, last seen by urgent care on 04/03/2023 for lice.  Lab Tests/Imaging studies: I personally interpreted labs/imaging and the pertinent results include: CBC unremarkable BMP unremarkable Respiratory panel unremarkable.   Medications: I ordered medication including  amlodipine.  I have reviewed the patients home medicines and have made adjustments as needed.  Critical Interventions: None  Social Determinants of Health: Currently does not have PCP as her previous PCP was in [place]  Disposition: After consideration of the diagnostic results and the patients response to treatment, I feel that the patient would benefit from discharge and treatment as above.   emergency department workup does not suggest an emergent condition requiring admission or immediate intervention beyond what has been performed at this time. The plan is: Follow-up with PCP for blood pressure and for loss of taste, return to the ER for new or worsening symptoms. The patient is safe for discharge and has been instructed to return immediately for worsening symptoms, change in symptoms or Brittany other concerns.   Final diagnoses:  Hypertensive urgency  Loss of taste  Metallic taste  Dry mouth    ED Discharge Orders          Ordered    nystatin (MYCOSTATIN) 100000 UNIT/ML suspension  4 times daily,   Status:  Discontinued        09/10/24 2041    nystatin (MYCOSTATIN) 100000 UNIT/ML suspension  4 times daily        09/10/24 2044    amLODipine (NORVASC) 5 MG tablet  Daily        09/10/24 2059               Brysan Mcevoy S, PA-C 09/10/24 2059    Yolande Lamar BROCKS, MD 09/12/24 1037

## 2024-09-10 NOTE — Discharge Instructions (Addendum)
 You are seen today for high blood pressure, loss of taste, metallic taste and dry mouth.  This been ongoing for 2 weeks.  Your lab work and physical exam today were very reassuring I have low suspicion of any emergent causes of your symptoms today.  However you will still need to follow-up with PCP for further management and monitoring.  I am sending in some nystatin suspension per your request, take 4 times a day until 48 hours after symptoms decrease.  I am also sending in some blood pressure medications for you today because your blood pressure was very elevated requiring medications.  This is a different medication than what you were provided earlier.  You will need to continue to take this and follow-up with your PCP for further represcription and follow-up.  Recommend you take a log of your blood pressures to evaluate both before and after medications and provide this to the PCP.

## 2024-09-10 NOTE — ED Notes (Signed)
 Reviewed AVS/discharge instruction with patient. Time allotted for and all questions answered. Patient is agreeable for d/c and escorted to ed exit by staff.

## 2024-09-10 NOTE — ED Triage Notes (Signed)
 Metallic taste, decreased normal taste dry mouth x 2 weeks  Reports white coat syndrome
